# Patient Record
Sex: Female | Born: 1955 | Race: Black or African American | Hispanic: No | Smoking: Never smoker
Health system: Southern US, Community
[De-identification: ages and names within clinical notes are randomized; demographics above are authoritative.]

## PROBLEM LIST (undated history)

## (undated) DIAGNOSIS — G4733 Obstructive sleep apnea (adult) (pediatric): Secondary | ICD-10-CM

## (undated) DIAGNOSIS — E7800 Pure hypercholesterolemia, unspecified: Secondary | ICD-10-CM

## (undated) DIAGNOSIS — E119 Type 2 diabetes mellitus without complications: Secondary | ICD-10-CM

## (undated) DIAGNOSIS — Z9989 Dependence on other enabling machines and devices: Secondary | ICD-10-CM

## (undated) DIAGNOSIS — I10 Essential (primary) hypertension: Secondary | ICD-10-CM

## (undated) DIAGNOSIS — M1990 Unspecified osteoarthritis, unspecified site: Secondary | ICD-10-CM

## (undated) DIAGNOSIS — J42 Unspecified chronic bronchitis: Secondary | ICD-10-CM

## (undated) DIAGNOSIS — R569 Unspecified convulsions: Secondary | ICD-10-CM

## (undated) DIAGNOSIS — G459 Transient cerebral ischemic attack, unspecified: Secondary | ICD-10-CM

## (undated) DIAGNOSIS — I509 Heart failure, unspecified: Secondary | ICD-10-CM

---

## 1981-10-23 HISTORY — PX: TUBAL LIGATION: SHX77

## 2005-10-20 ENCOUNTER — Emergency Department (HOSPITAL_COMMUNITY): Admission: EM | Admit: 2005-10-20 | Discharge: 2005-10-20 | Payer: Self-pay | Admitting: Emergency Medicine

## 2014-07-15 ENCOUNTER — Encounter (HOSPITAL_COMMUNITY): Payer: Self-pay | Admitting: Emergency Medicine

## 2014-07-15 DIAGNOSIS — G459 Transient cerebral ischemic attack, unspecified: Principal | ICD-10-CM | POA: Diagnosis present

## 2014-07-15 DIAGNOSIS — Z79899 Other long term (current) drug therapy: Secondary | ICD-10-CM

## 2014-07-15 DIAGNOSIS — Z6841 Body Mass Index (BMI) 40.0 and over, adult: Secondary | ICD-10-CM

## 2014-07-15 DIAGNOSIS — G8190 Hemiplegia, unspecified affecting unspecified side: Secondary | ICD-10-CM | POA: Diagnosis present

## 2014-07-15 DIAGNOSIS — E785 Hyperlipidemia, unspecified: Secondary | ICD-10-CM | POA: Diagnosis present

## 2014-07-15 DIAGNOSIS — G4733 Obstructive sleep apnea (adult) (pediatric): Secondary | ICD-10-CM | POA: Diagnosis present

## 2014-07-15 DIAGNOSIS — R29898 Other symptoms and signs involving the musculoskeletal system: Secondary | ICD-10-CM | POA: Diagnosis not present

## 2014-07-15 DIAGNOSIS — I10 Essential (primary) hypertension: Secondary | ICD-10-CM | POA: Diagnosis present

## 2014-07-15 DIAGNOSIS — E119 Type 2 diabetes mellitus without complications: Secondary | ICD-10-CM | POA: Diagnosis present

## 2014-07-15 LAB — COMPREHENSIVE METABOLIC PANEL
ALBUMIN: 3.6 g/dL (ref 3.5–5.2)
ALT: 16 U/L (ref 0–35)
AST: 18 U/L (ref 0–37)
Alkaline Phosphatase: 77 U/L (ref 39–117)
Anion gap: 15 (ref 5–15)
BUN: 14 mg/dL (ref 6–23)
CO2: 29 mEq/L (ref 19–32)
CREATININE: 0.8 mg/dL (ref 0.50–1.10)
Calcium: 8.9 mg/dL (ref 8.4–10.5)
Chloride: 98 mEq/L (ref 96–112)
GFR calc Af Amer: >90 mL/min (ref >90)
GFR calc non Af Amer: 80 mL/min — ABNORMAL LOW (ref >90)
Glucose, Bld: 251 mg/dL — ABNORMAL HIGH (ref 70–99)
Potassium: 3.5 mEq/L — ABNORMAL LOW (ref 3.7–5.3)
Sodium: 142 mEq/L (ref 137–147)
TOTAL PROTEIN: 7.4 g/dL (ref 6.0–8.3)
Total Bilirubin: 0.7 mg/dL (ref 0.3–1.2)

## 2014-07-15 LAB — CBC WITH DIFFERENTIAL/PLATELET
BASOS ABS: 0 10*3/uL (ref 0.0–0.1)
BASOS PCT: 0 % (ref 0–1)
EOS ABS: 0.1 10*3/uL (ref 0.0–0.7)
Eosinophils Relative: 1 % (ref 0–5)
HCT: 39.4 % (ref 36.0–46.0)
Hemoglobin: 13.4 g/dL (ref 12.0–15.0)
Lymphocytes Relative: 26 % (ref 12–46)
Lymphs Abs: 2.1 10*3/uL (ref 0.7–4.0)
MCH: 29.9 pg (ref 26.0–34.0)
MCHC: 34 g/dL (ref 30.0–36.0)
MCV: 87.9 fL (ref 78.0–100.0)
MONOS PCT: 7 % (ref 3–12)
Monocytes Absolute: 0.5 10*3/uL (ref 0.1–1.0)
NEUTROS ABS: 5.4 10*3/uL (ref 1.7–7.7)
Neutrophils Relative %: 66 % (ref 43–77)
Platelets: 219 10*3/uL (ref 150–400)
RBC: 4.48 MIL/uL (ref 3.87–5.11)
RDW: 13.8 % (ref 11.5–15.5)
WBC: 8.2 10*3/uL (ref 4.0–10.5)

## 2014-07-15 NOTE — ED Notes (Signed)
Pt. reports generalized body numbness onset this evening 9 pm , no numbness at arrival , alert and oriented / speech clear , no facial asymmetry , no arm drift . Denies pain / respirations unlabored .

## 2014-07-16 ENCOUNTER — Inpatient Hospital Stay (HOSPITAL_COMMUNITY): Payer: Federal, State, Local not specified - PPO

## 2014-07-16 ENCOUNTER — Inpatient Hospital Stay (HOSPITAL_COMMUNITY)
Admission: EM | Admit: 2014-07-16 | Discharge: 2014-07-17 | DRG: 069 | Disposition: A | Discharge status: Home / Self Care | Payer: Federal, State, Local not specified - PPO | Attending: Internal Medicine | Admitting: Internal Medicine

## 2014-07-16 ENCOUNTER — Encounter (HOSPITAL_COMMUNITY): Payer: Self-pay

## 2014-07-16 ENCOUNTER — Emergency Department (HOSPITAL_COMMUNITY): Payer: Federal, State, Local not specified - PPO

## 2014-07-16 DIAGNOSIS — R2990 Unspecified symptoms and signs involving the nervous system: Secondary | ICD-10-CM | POA: Insufficient documentation

## 2014-07-16 DIAGNOSIS — I369 Nonrheumatic tricuspid valve disorder, unspecified: Secondary | ICD-10-CM

## 2014-07-16 DIAGNOSIS — G4733 Obstructive sleep apnea (adult) (pediatric): Secondary | ICD-10-CM

## 2014-07-16 DIAGNOSIS — Z79899 Other long term (current) drug therapy: Secondary | ICD-10-CM | POA: Diagnosis not present

## 2014-07-16 DIAGNOSIS — R209 Unspecified disturbances of skin sensation: Secondary | ICD-10-CM

## 2014-07-16 DIAGNOSIS — I10 Essential (primary) hypertension: Secondary | ICD-10-CM | POA: Diagnosis present

## 2014-07-16 DIAGNOSIS — E119 Type 2 diabetes mellitus without complications: Secondary | ICD-10-CM

## 2014-07-16 DIAGNOSIS — E785 Hyperlipidemia, unspecified: Secondary | ICD-10-CM | POA: Diagnosis present

## 2014-07-16 DIAGNOSIS — R200 Anesthesia of skin: Secondary | ICD-10-CM

## 2014-07-16 DIAGNOSIS — R29898 Other symptoms and signs involving the musculoskeletal system: Secondary | ICD-10-CM | POA: Diagnosis present

## 2014-07-16 DIAGNOSIS — G459 Transient cerebral ischemic attack, unspecified: Secondary | ICD-10-CM | POA: Diagnosis present

## 2014-07-16 DIAGNOSIS — Z6841 Body Mass Index (BMI) 40.0 and over, adult: Secondary | ICD-10-CM | POA: Diagnosis not present

## 2014-07-16 DIAGNOSIS — Z9989 Dependence on other enabling machines and devices: Secondary | ICD-10-CM

## 2014-07-16 DIAGNOSIS — G8190 Hemiplegia, unspecified affecting unspecified side: Secondary | ICD-10-CM | POA: Diagnosis present

## 2014-07-16 HISTORY — DX: Essential (primary) hypertension: I10

## 2014-07-16 LAB — BASIC METABOLIC PANEL
Anion gap: 15 (ref 5–15)
BUN: 16 mg/dL (ref 6–23)
CO2: 27 meq/L (ref 19–32)
Calcium: 8.6 mg/dL (ref 8.4–10.5)
Chloride: 99 mEq/L (ref 96–112)
Creatinine, Ser: 0.81 mg/dL (ref 0.50–1.10)
GFR calc Af Amer: >90 mL/min (ref >90)
GFR, EST NON AFRICAN AMERICAN: 79 mL/min — AB (ref >90)
GLUCOSE: 233 mg/dL — AB (ref 70–99)
POTASSIUM: 3.4 meq/L — AB (ref 3.7–5.3)
Sodium: 141 mEq/L (ref 137–147)

## 2014-07-16 LAB — URINALYSIS, ROUTINE W REFLEX MICROSCOPIC
Bilirubin Urine: NEGATIVE
GLUCOSE, UA: NEGATIVE mg/dL
Hgb urine dipstick: NEGATIVE
Ketones, ur: NEGATIVE mg/dL
Leukocytes, UA: NEGATIVE
NITRITE: NEGATIVE
PH: 5 (ref 5.0–8.0)
Protein, ur: NEGATIVE mg/dL
SPECIFIC GRAVITY, URINE: 1.014 (ref 1.005–1.030)
Urobilinogen, UA: 0.2 mg/dL (ref 0.0–1.0)

## 2014-07-16 LAB — GLUCOSE, CAPILLARY
Glucose-Capillary: 199 mg/dL — ABNORMAL HIGH (ref 70–99)
Glucose-Capillary: 187 mg/dL — ABNORMAL HIGH (ref 70–99)

## 2014-07-16 LAB — HEMOGLOBIN A1C
HEMOGLOBIN A1C: 10.3 % — AB (ref <5.7)
HEMOGLOBIN A1C: 10.7 % — AB (ref <5.7)
MEAN PLASMA GLUCOSE: 249 mg/dL — AB (ref <117)
MEAN PLASMA GLUCOSE: 260 mg/dL — AB (ref <117)

## 2014-07-16 LAB — RAPID URINE DRUG SCREEN, HOSP PERFORMED
AMPHETAMINES: NOT DETECTED
BARBITURATES: NOT DETECTED
Benzodiazepines: NOT DETECTED
Cocaine: NOT DETECTED
Opiates: NOT DETECTED
Tetrahydrocannabinol: NOT DETECTED

## 2014-07-16 LAB — TROPONIN I
Troponin I: <0.3 ng/mL (ref <0.30)
Troponin I: <0.3 ng/mL (ref <0.30)
Troponin I: <0.3 ng/mL (ref <0.30)

## 2014-07-16 LAB — CBG MONITORING, ED: GLUCOSE-CAPILLARY: 180 mg/dL — AB (ref 70–99)

## 2014-07-16 MED ORDER — STUDY - INVESTIGATIONAL DRUG SIMPLE RECORD
100.0000 mg | Freq: Every day | Status: DC
Start: 2014-07-17 — End: 2014-07-17
  Administered 2014-07-17: 100 mg via ORAL
  Filled 2014-07-16 (×2): qty 100

## 2014-07-16 MED ORDER — STUDY - INVESTIGATIONAL DRUG SIMPLE RECORD
180.0000 mg | Freq: Once | Status: AC
  Administered 2014-07-16: 180 mg via ORAL
  Filled 2014-07-16: qty 180

## 2014-07-16 MED ORDER — INSULIN ASPART 100 UNIT/ML ~~LOC~~ SOLN
0.0000 [IU] | Freq: Three times a day (TID) | SUBCUTANEOUS | Status: DC
  Administered 2014-07-16 – 2014-07-17 (×4): 3 [IU] via SUBCUTANEOUS
  Administered 2014-07-17: 5 [IU] via SUBCUTANEOUS
  Filled 2014-07-16: qty 1

## 2014-07-16 MED ORDER — HEPARIN SODIUM (PORCINE) 5000 UNIT/ML IJ SOLN
5000.0000 [IU] | Freq: Three times a day (TID) | INTRAMUSCULAR | Status: DC
  Administered 2014-07-16: 5000 [IU] via SUBCUTANEOUS
  Filled 2014-07-16: qty 1

## 2014-07-16 MED ORDER — STUDY - INVESTIGATIONAL DRUG SIMPLE RECORD
90.0000 mg | Freq: Two times a day (BID) | Status: DC
  Administered 2014-07-17: 90 mg via ORAL
  Filled 2014-07-16 (×3): qty 90

## 2014-07-16 MED ORDER — INSULIN ASPART 100 UNIT/ML ~~LOC~~ SOLN: 0.0000 [IU] | Freq: Three times a day (TID) | SUBCUTANEOUS | Status: DC

## 2014-07-16 MED ORDER — PREGABALIN 50 MG PO CAPS
50.0000 mg | ORAL_CAPSULE | Freq: Every day | ORAL | Status: DC
  Administered 2014-07-16: 50 mg via ORAL
  Filled 2014-07-16: qty 1

## 2014-07-16 MED ORDER — LORAZEPAM 2 MG/ML IJ SOLN
1.0000 mg | Freq: Once | INTRAMUSCULAR | Status: AC
  Administered 2014-07-16: 1 mg via INTRAVENOUS
  Filled 2014-07-16: qty 1

## 2014-07-16 MED ORDER — ATORVASTATIN CALCIUM 40 MG PO TABS
40.0000 mg | ORAL_TABLET | Freq: Every day | ORAL | Status: DC
  Administered 2014-07-16: 40 mg via ORAL
  Filled 2014-07-16 (×2): qty 1

## 2014-07-16 MED ORDER — STUDY - INVESTIGATIONAL DRUG SIMPLE RECORD
300.0000 mg | Freq: Once | Status: AC
  Administered 2014-07-16: 300 mg via ORAL
  Filled 2014-07-16: qty 300

## 2014-07-16 MED ORDER — INSULIN GLARGINE 100 UNIT/ML ~~LOC~~ SOLN
5.0000 [IU] | Freq: Every day | SUBCUTANEOUS | Status: DC
  Administered 2014-07-16: 5 [IU] via SUBCUTANEOUS
  Filled 2014-07-16 (×2): qty 0.05

## 2014-07-16 MED ORDER — POTASSIUM CHLORIDE 10 MEQ/100ML IV SOLN
10.0000 meq | INTRAVENOUS | Status: AC
  Administered 2014-07-16 (×2): 10 meq via INTRAVENOUS
  Filled 2014-07-16 (×2): qty 100

## 2014-07-16 MED ORDER — INSULIN ASPART 100 UNIT/ML ~~LOC~~ SOLN: 0.0000 [IU] | Freq: Every day | SUBCUTANEOUS | Status: DC

## 2014-07-16 MED ORDER — TRAMADOL HCL 50 MG PO TABS: 50.0000 mg | ORAL_TABLET | Freq: Every day | ORAL | Status: DC | PRN

## 2014-07-16 MED ORDER — CARVEDILOL 12.5 MG PO TABS
25.0000 mg | ORAL_TABLET | Freq: Two times a day (BID) | ORAL | Status: DC
Start: 2014-07-16 — End: 2014-07-17
  Administered 2014-07-16 – 2014-07-17 (×4): 25 mg via ORAL
  Filled 2014-07-16: qty 1
  Filled 2014-07-16: qty 2
  Filled 2014-07-16: qty 1
  Filled 2014-07-16: qty 2
  Filled 2014-07-16: qty 1
  Filled 2014-07-16: qty 2

## 2014-07-16 MED ORDER — STROKE: EARLY STAGES OF RECOVERY BOOK
Freq: Once | Status: AC
  Administered 2014-07-16: 1
  Filled 2014-07-16: qty 1

## 2014-07-16 MED ORDER — ASPIRIN EC 325 MG PO TBEC
325.0000 mg | DELAYED_RELEASE_TABLET | Freq: Every day | ORAL | Status: DC
  Administered 2014-07-16: 325 mg via ORAL
  Filled 2014-07-16: qty 1

## 2014-07-16 MED ORDER — CLONIDINE HCL 0.1 MG PO TABS
0.1000 mg | ORAL_TABLET | Freq: Two times a day (BID) | ORAL | Status: DC
  Administered 2014-07-16 – 2014-07-17 (×3): 0.1 mg via ORAL
  Filled 2014-07-16 (×3): qty 1

## 2014-07-16 MED ORDER — SENNOSIDES-DOCUSATE SODIUM 8.6-50 MG PO TABS
1.0000 | ORAL_TABLET | Freq: Every evening | ORAL | Status: DC | PRN
  Filled 2014-07-16: qty 1

## 2014-07-16 MED ORDER — HYDRALAZINE HCL 20 MG/ML IJ SOLN: 10.0000 mg | Freq: Four times a day (QID) | INTRAMUSCULAR | Status: DC | PRN

## 2014-07-16 NOTE — ED Notes (Signed)
Patient transported to CT 

## 2014-07-16 NOTE — H&P (Signed)
[facility] History and Physical  Jacquelynne Guedes ZOX:[id number] DOB: July 08, 1956 DOA: 07/16/2014  Referring physician: ED physician PCP: Allyson Sabal, MD  Specialists:   Chief Complaint: Left side numbness and weakness  HPI: Christy Potter is a 58 y.o. female  with past medical history of HTN, DM, OSA, who presents with left sided numbness and weakness.  Patient reports that while she was working last evening, she started feeling numb and weak in her left side face, arm and leg at about 9:00 PM. She was noticed to have slurred speech by her colleagues. She has a mild headache, but does not have change of hearing or vision. No fever, chills, cough, chest pain, shortness of breath, nausea, vomiting, abdominal pain, dysuria. She was brought to ED by her cousin who is her coworker. Her symptoms resolved after she arrived to ED. CT brain showed no acute intracranial findings. Neurology was consulted, suggested admitted to inpatient for TIA/stroke workup     Review of Systems: As presented in the history of presenting illness, rest negative.  Where does patient live?  Lives a lone in [place] Can patient participate in ADLs? Yes  Allergy: No Known Allergies  Past Medical History  Diagnosis Date  . Diabetes mellitus without complication   . Hypertension     History reviewed. No pertinent past surgical history.  Social History:  reports that she has never smoked. She does not have any smokeless tobacco history on file. She reports that she does not drink alcohol or use illicit drugs.  Family History:  Family History  Problem Relation Age of Onset  . Hypertension Father   . Hypertension Sister      Prior to Admission medications   Medication Sig Start Date End Date Taking? Authorizing Provider  Alogliptin-Metformin HCl 12.02-999 MG TABS Take 1 tablet by mouth 2 (two) times daily with a meal.   Yes Historical Provider, MD  amLODipine-benazepril (LOTREL) 10-40 MG per capsule  Take 1 capsule by mouth daily.   Yes Historical Provider, MD  carvedilol (COREG) 25 MG tablet Take 25 mg by mouth 2 (two) times daily with a meal.   Yes Historical Provider, MD  celecoxib (CELEBREX) 200 MG capsule Take 200 mg by mouth at bedtime.   Yes Historical Provider, MD  cloNIDine (CATAPRES) 0.1 MG tablet Take 0.1 mg by mouth 2 (two) times daily.   Yes Historical Provider, MD  furosemide (LASIX) 40 MG tablet Take 40 mg by mouth daily.   Yes Historical Provider, MD  potassium chloride SA (K-DUR,KLOR-CON) 20 MEQ tablet Take 20 mEq by mouth daily.   Yes Historical Provider, MD  pregabalin (LYRICA) 50 MG capsule Take 50 mg by mouth at bedtime.   Yes Historical Provider, MD  telmisartan-hydrochlorothiazide (MICARDIS HCT) 80-25 MG per tablet Take 1 tablet by mouth daily.   Yes Historical Provider, MD  traMADol (ULTRAM) 50 MG tablet Take 50 mg by mouth daily as needed for moderate pain.   Yes Historical Provider, MD    Physical Exam: Filed Vitals:   07/16/14 0300 07/16/14 0330 07/16/14 0340 07/16/14 0342  BP: 118/60 145/70    Pulse: 72 76    Temp:    98.4 F (36.9 C)  TempSrc:    Oral  Resp: 20 23    Height:      Weight:      SpO2: 94% 93% 94%    General: Not in acute distress HEENT:       Eyes: PERRL, EOMI, no scleral icterus  ENT: No discharge from the ears and nose, no pharynx injection, no tonsillar enlargement.        Neck: No JVD, no bruit, no mass felt. Cardiac: S1/S2, RRR, No murmurs, gallops or rubs Pulm: Good air movement bilaterally. Clear to auscultation bilaterally. No rales, wheezing, rhonchi or rubs. Abd: Soft, nondistended, nontender, no rebound pain, no organomegaly, BS present Ext: No edema. 2+DP/PT pulse bilaterally Musculoskeletal: No joint deformities, erythema, or stiffness, ROM full Skin: No rashes.  Neuro: Alert and oriented X3, cranial nerves II-XII grossly intact, muscle strength 5/5 in all extremeties, sensation to light touch intact. Brachial reflex  2+ bilaterally. Knee reflex 1+ bilaterally. Negative Babinski's sign. Normal finger to nose test. Psych: Patient is not psychotic, no suicidal or hemocidal ideation.  Labs on Admission:  Basic Metabolic Panel:  Recent Labs Lab 07/15/14 2202  NA 142  K 3.5*  CL 98  CO2 29  GLUCOSE 251*  BUN 14  CREATININE 0.80  CALCIUM 8.9   Liver Function Tests:  Recent Labs Lab 07/15/14 2202  AST 18  ALT 16  ALKPHOS 77  BILITOT 0.7  PROT 7.4  ALBUMIN 3.6   No results found for this basename: LIPASE, AMYLASE,  in the last 168 hours No results found for this basename: AMMONIA,  in the last 168 hours CBC:  Recent Labs Lab 07/15/14 2202  WBC 8.2  NEUTROABS 5.4  HGB 13.4  HCT 39.4  MCV 87.9  PLT 219   Cardiac Enzymes: No results found for this basename: CKTOTAL, CKMB, CKMBINDEX, TROPONINI,  in the last 168 hours  BNP (last 3 results) No results found for this basename: PROBNP,  in the last 8760 hours CBG: No results found for this basename: GLUCAP,  in the last 168 hours  Radiological Exams on Admission: Ct Head Wo Contrast  07/16/2014   CLINICAL DATA:  Left-sided numbness.  EXAM: CT HEAD WITHOUT CONTRAST  TECHNIQUE: Contiguous axial images were obtained from the base of the skull through the vertex without intravenous contrast.  COMPARISON:  None.  FINDINGS: Skull and Sinuses:There are secretions layering within the right sphenoid sinus. Opacification of the right mastoid tip, with sclerosis, most consistent with chronic mastoiditis. Postinflammatory mucosal disease in the inferior right maxillary antrum.  Orbits: No acute abnormality.  Brain: No evidence of acute abnormality, such as acute infarction, hemorrhage, hydrocephalus, or mass lesion/mass effect. There is a halo of low-attenuation around the nondilated lateral ventricles. Although nonspecific, this is most consistent with chronic small vessel disease -especially in light of history of hypertension and diabetes. Age related  generalized cerebral volume loss.  IMPRESSION: 1. No acute intracranial findings. 2. Mild chronic small vessel disease. 3. Right sphenoid sinus effusion.   Electronically Signed   By: Tiburcio Pea M.D.   On: 07/16/2014 02:12    EKG: Independently reviewed.   Assessment/Plan Principal Problem:   TIA (transient ischemic attack) Active Problems:   Diabetes mellitus without complication   Hypertension   OSA on CPAP   1.TIA: Patient's presentation is likely due to TIA or small stroke. No signs of infection. CT head has no acute intracranial findings. Neurology was consulted, suggested TIA or stroke workup. Her risk factors including diabetes type 2, hypertension and OSA.   -  Admitted to telemetry bed and a follow neurology's recommendation for further workup and treatment -  HgbA1c, fasting lipid panel -  MRI, MRA  of the brain without contrast -  2D Echocardiogram -  Carotid dopplers -  aspirin 325  mg daily, lipitor -  PT/OT SLP -  trop x 3  -  UDS -  EKG - PT/OT  2. HTN: Blood pressure is 145/70. -Will hold Lotrel, Lasix and Micardis-HCTZ in the setting of acute stroke or TIA for permissive hypertension - Continue Coreg and clonidine to avoid significant rebound effect  3. DM-II: on Alogliptin-metformin at home - will hold home oral medications - Start sliding scale insulin - start lantus 5 U daily - Check A1c  4. OSA: CPAP   DVT ppx: SQ Heparin   Code Status: Full code Family Communication: None at bed side. Disposition Plan: Admit to inpatient   Lorretta Harp [facility] Pager [phone]  If 7PM-7AM, please contact night-coverage [email or web address] Password [email or web address] 07/16/2014, 4:00 AM

## 2014-07-16 NOTE — ED Notes (Signed)
Pt  has returned from mri ?

## 2014-07-16 NOTE — Progress Notes (Signed)
RT placed patient on CPAP, auto 5-15cmH20, 21%.  Patient tolerating well.

## 2014-07-16 NOTE — Progress Notes (Signed)
OT Cancellation Note  Patient Details Name: Christy Potter MRN: [id number] DOB: [birth year]   Cancelled Treatment:     Pt off floor at MRI. OT will follow up as available to complete evaluation.   Nena Jordan M 07/16/2014, 11:25 AM  Carney Living, OTR/L Occupational Therapist [phone] (pager)

## 2014-07-16 NOTE — Consult Note (Signed)
Referring Physician: Dr. Reather Converse    Chief Complaint: left sided numbness-weakness, left face weakness, dysarthria  HPI:                                                                                                                                         Christy Potter is an 58 y.o. female with a past medical history significant for HTN, DM, comes in accompanied by her family for further evaluation of the above stated symptoms. She said that she was doing well until approximately 8:55 pm last night when she developed gradual onset of numbness-tingling of the left face travelling to the left arm and leg and also noticed heaviness/weakness of the left leg to the point that she couldn't put weight on it. She said that her coworkers noticed that she was slurring her words and the left side of her face was droopier than the right. Had some HA afterwards but denies vertigo, double vision, difficulty swallowing, confusion, vision changes, chest pain, SOB, or palpitations. Takes aspirin 81 mg daily. Daughter is at the bedside and said that the speech is significantly improved but the left face is little bit droopy. CT brain showed no acute intracranial findings.    Date last known well: 07/15/14 Time last known well: 8:55 pm tPA Given: no, late presentation  Past Medical History  Diagnosis Date  . Diabetes mellitus without complication   . Hypertension     History reviewed. No pertinent past surgical history.  No family history on file. Social History:  reports that she has never smoked. She does not have any smokeless tobacco history on file. She reports that she does not drink alcohol or use illicit drugs.  Allergies: No Known Allergies  Medications:                                                                                                                           I have reviewed the patient's current medications.  ROS:  History obtained from the patient, family, and chart review.  General ROS: negative for - chills, fatigue, fever, night sweats, or weight loss Psychological ROS: negative for - behavioral disorder, hallucinations, memory difficulties, mood swings or suicidal ideation Ophthalmic ROS: negative for - blurry vision, double vision, eye pain or loss of vision ENT ROS: negative for - epistaxis, nasal discharge, oral lesions, sore throat, tinnitus or vertigo Allergy and Immunology ROS: negative for - hives or itchy/watery eyes Hematological and Lymphatic ROS: negative for - bleeding problems, bruising or swollen lymph nodes Endocrine ROS: negative for - galactorrhea, hair pattern changes, polydipsia/polyuria or temperature intolerance Respiratory ROS: negative for - cough, hemoptysis, shortness of breath or wheezing Cardiovascular ROS: negative for - chest pain, dyspnea on exertion, edema or irregular heartbeat Gastrointestinal ROS: negative for - abdominal pain, diarrhea, hematemesis, nausea/vomiting or stool incontinence Genito-Urinary ROS: negative for - dysuria, hematuria, incontinence or urinary frequency/urgency Musculoskeletal ROS: negative for - joint swelling Neurological ROS: as noted in HPI Dermatological ROS: negative for rash and skin lesion changes  Physical exam: pleasant female in no apparent distress. Blood pressure 127/79, pulse 73, temperature 98.3 F (36.8 C), temperature source Oral, resp. rate 18, height 5' (1.524 m), weight 123.832 kg (273 lb), SpO2 96.00%. Head: normocephalic. Neck: supple, no bruits, no JVD. Cardiac: no murmurs. Lungs: clear. Abdomen: soft, no tender, no mass. Extremities: no edema.  Neurologic Examination:                                                                                                      General: Mental Status: Alert, oriented, thought content appropriate.  Speech  fluent without evidence of aphasia.  Able to follow 3 step commands without difficulty. Cranial Nerves: II: Discs flat bilaterally; Visual fields grossly normal, pupils equal, round, reactive to light and accommodation III,IV, VI: ptosis not present, extra-ocular motions intact bilaterally V,VII: smile symmetric, facial light touch sensation normal bilaterally VIII: hearing normal bilaterally IX,X: gag reflex present XI: bilateral shoulder shrug XII: midline tongue extension without atrophy or fasciculations Motor: Mild left sided weakness Tone and bulk:normal tone throughout Sensory: Pinprick and light touch mildly diminished left leg Deep Tendon Reflexes:  1 all over Plantars: Right: downgoing   Left: downgoing Cerebellar: normal finger-to-nose,  normal heel-to-shin test Gait:  No tested    Results for orders placed during the hospital encounter of 07/16/14 (from the past 48 hour(s))  CBC WITH DIFFERENTIAL     Status: None   Collection Time    07/15/14 10:02 PM      Result Value Ref Range   WBC 8.2  4.0 - 10.5 K/uL   RBC 4.48  3.87 - 5.11 MIL/uL   Hemoglobin 13.4  12.0 - 15.0 g/dL   HCT 39.4  36.0 - 46.0 %   MCV 87.9  78.0 - 100.0 fL   MCH 29.9  26.0 - 34.0 pg   MCHC 34.0  30.0 - 36.0 g/dL   RDW 13.8  11.5 - 15.5 %   Platelets 219  150 - 400 K/uL   Neutrophils Relative % 66  43 - 77 %   Neutro Abs 5.4  1.7 - 7.7 K/uL   Lymphocytes Relative 26  12 - 46 %   Lymphs Abs 2.1  0.7 - 4.0 K/uL   Monocytes Relative 7  3 - 12 %   Monocytes Absolute 0.5  0.1 - 1.0 K/uL   Eosinophils Relative 1  0 - 5 %   Eosinophils Absolute 0.1  0.0 - 0.7 K/uL   Basophils Relative 0  0 - 1 %   Basophils Absolute 0.0  0.0 - 0.1 K/uL  COMPREHENSIVE METABOLIC PANEL     Status: Abnormal   Collection Time    07/15/14 10:02 PM      Result Value Ref Range   Sodium 142  137 - 147 mEq/L   Potassium 3.5 (*) 3.7 - 5.3 mEq/L   Chloride 98  96 - 112 mEq/L   CO2 29  19 - 32 mEq/L   Glucose, Bld 251  (*) 70 - 99 mg/dL   BUN 14  6 - 23 mg/dL   Creatinine, Ser 0.80  0.50 - 1.10 mg/dL   Calcium 8.9  8.4 - 10.5 mg/dL   Total Protein 7.4  6.0 - 8.3 g/dL   Albumin 3.6  3.5 - 5.2 g/dL   AST 18  0 - 37 U/L   ALT 16  0 - 35 U/L   Alkaline Phosphatase 77  39 - 117 U/L   Total Bilirubin 0.7  0.3 - 1.2 mg/dL   GFR calc non Af Amer 80 (*) >90 mL/min   GFR calc Af Amer >90  >90 mL/min   Comment: (NOTE)     The eGFR has been calculated using the CKD EPI equation.     This calculation has not been validated in all clinical situations.     eGFR's persistently <90 mL/min signify possible Chronic Kidney     Disease.   Anion gap 15  5 - 15   No results found.   Assessment: 58 y.o. female with acute onset left sided weakness, dysarthria, and left face weakness that is remarkably better at this time. CT brain showed no acute intracranial findings. Concern for right subcortical infarct. Patient out of the window for IV thrombolysis and her deficits are not severe enough to suggest proximal MCA clot/thrombus. Admit to medicine and complete stroke work up. Aspirin 325 mg daily after she passes swallowing evaluation. Stroke team will follow up in am.  Stroke Risk Factors - HTN, DM, Plan: 1. HgbA1c, fasting lipid panel 2. MRI, MRA  of the brain without contrast 3. Echocardiogram 4. Carotid dopplers 5. Prophylactic therapy-aspirin 325 mg daily 6. Risk factor modification 7. Telemetry monitoring 8. Frequent neuro checks 9. PT/OT SLP   Dorian Pod, MD [facility] [phone]  07/16/2014, 1:51 AM

## 2014-07-16 NOTE — ED Provider Notes (Signed)
CSN: [id number]     Arrival date & time 07/15/14  2143 History   First MD Initiated Contact with Patient 07/16/14 0046     Chief Complaint  Patient presents with  . Numbness     (Consider location/radiation/quality/duration/timing/severity/associated sxs/prior Treatment) HPI Comments: 58 year old female with diabetes, high blood pressure presents after episode of left-sided numbness and speech difficulty around 9 PM this evening. Symptoms resolved just prior to arrival. No history of similar stroke history. Patient did not have acute onset headache prior and no fevers. Patient feels well currently. Symptoms improved with time.  The history is provided by the patient.    Past Medical History  Diagnosis Date  . Diabetes mellitus without complication   . Hypertension    History reviewed. No pertinent past surgical history. No family history on file. History  Substance Use Topics  . Smoking status: Never Smoker   . Smokeless tobacco: Not on file  . Alcohol Use: No   OB History   Grav Para Term Preterm Abortions TAB SAB Ect Mult Living                 Review of Systems  Constitutional: Negative for fever and chills.  HENT: Negative for congestion.   Eyes: Negative for visual disturbance.  Respiratory: Negative for shortness of breath.   Cardiovascular: Negative for chest pain.  Gastrointestinal: Negative for vomiting and abdominal pain.  Genitourinary: Negative for dysuria and flank pain.  Musculoskeletal: Negative for back pain, neck pain and neck stiffness.  Skin: Negative for rash.  Neurological: Positive for speech difficulty and numbness. Negative for weakness, light-headedness and headaches.      Allergies  Review of patient's allergies indicates no known allergies.  Home Medications   Prior to Admission medications   Medication Sig Start Date End Date Taking? Authorizing Provider  Alogliptin-Metformin HCl 12.02-999 MG TABS Take 1 tablet by mouth 2 (two) times  daily with a meal.   Yes Historical Provider, MD  amLODipine-benazepril (LOTREL) 10-40 MG per capsule Take 1 capsule by mouth daily.   Yes Historical Provider, MD  carvedilol (COREG) 25 MG tablet Take 25 mg by mouth 2 (two) times daily with a meal.   Yes Historical Provider, MD  celecoxib (CELEBREX) 200 MG capsule Take 200 mg by mouth at bedtime.   Yes Historical Provider, MD  cloNIDine (CATAPRES) 0.1 MG tablet Take 0.1 mg by mouth 2 (two) times daily.   Yes Historical Provider, MD  furosemide (LASIX) 40 MG tablet Take 40 mg by mouth daily.   Yes Historical Provider, MD  potassium chloride SA (K-DUR,KLOR-CON) 20 MEQ tablet Take 20 mEq by mouth daily.   Yes Historical Provider, MD  pregabalin (LYRICA) 50 MG capsule Take 50 mg by mouth at bedtime.   Yes Historical Provider, MD  telmisartan-hydrochlorothiazide (MICARDIS HCT) 80-25 MG per tablet Take 1 tablet by mouth daily.   Yes Historical Provider, MD  traMADol (ULTRAM) 50 MG tablet Take 50 mg by mouth daily as needed for moderate pain.   Yes Historical Provider, MD   BP 127/79  Pulse 73  Temp(Src) 98.3 F (36.8 C) (Oral)  Resp 18  Ht 5' (1.524 m)  Wt 273 lb (123.832 kg)  BMI 53.32 kg/m2  SpO2 96% Physical Exam  Nursing note and vitals reviewed. Constitutional: She is oriented to person, place, and time. She appears well-developed and well-nourished.  HENT:  Head: Normocephalic and atraumatic.  Eyes: Conjunctivae are normal. Right eye exhibits no discharge. Left eye exhibits no  discharge.  Neck: Normal range of motion. Neck supple. No tracheal deviation present.  Cardiovascular: Normal rate and regular rhythm.   Pulmonary/Chest: Effort normal and breath sounds normal.  Abdominal: Soft. She exhibits no distension. There is no tenderness. There is no guarding.  Musculoskeletal: She exhibits no edema.  Neurological: She is alert and oriented to person, place, and time. GCS eye subscore is 4. GCS verbal subscore is 5. GCS motor subscore is  6.  Mild left arm drift 5+ strength in  LE with f/e at major joints. Sensation to palpation intact in UE and LE. CNs 2-12 grossly intact.  EOMFI.  PERRL.   Finger nose and coordination intact bilateral.   Visual fields intact to finger testing.   Skin: Skin is warm. No rash noted.  Psychiatric: She has a normal mood and affect.    ED Course  Procedures (including critical care time) Labs Review Labs Reviewed  COMPREHENSIVE METABOLIC PANEL - Abnormal; Notable for the following:    Potassium 3.5 (*)    Glucose, Bld 251 (*)    GFR calc non Af Amer 80 (*)    All other components within normal limits  CBC WITH DIFFERENTIAL  URINALYSIS, ROUTINE W REFLEX MICROSCOPIC  HEMOGLOBIN A1C    Imaging Review No results found.   EKG Interpretation   Date/Time:  Wednesday July 15 2014 21:51:33 EDT Ventricular Rate:  80 PR Interval:  146 QRS Duration: 82 QT Interval:  410 QTC Calculation: 472 R Axis:   -16 Text Interpretation:  Normal sinus rhythm Moderate voltage criteria for  LVH, may be normal variant Borderline ECG Confirmed by Syair Fricker  MD, Crystallynn Noorani  ([id number]) on 07/16/2014 12:48:33 AM      MDM   Final diagnoses:  Left arm numbness  Stroke-like symptoms   Patient with 3 vascular risk factors presents after episode of stroke symptoms. Clinical concern for TIA versus small stroke. Mild left arm drift in ER. Patient feels well otherwise and neuro consult, CT head pending. Plan for telemetry observation with medicine.  No change in neuro exam in ER. Neurology evaluated and recommended MRI later today. Discussed the case with [facility]. The patients results and plan were reviewed and discussed.   Any x-rays performed were personally reviewed by myself.   Differential diagnosis were considered with the presenting HPI.  Medications  aspirin EC tablet 325 mg (not administered)    Filed Vitals:   07/15/14 2147 07/16/14 0048 07/16/14 0052  BP: 179/89 127/79   Pulse: 83  73   Temp: 98.3 F (36.8 C) 98.2 F (36.8 C) 98.3 F (36.8 C)  TempSrc: Oral Oral   Resp: 18 18   Height: 5' (1.524 m)    Weight: 273 lb (123.832 kg)    SpO2: 97% 96%     Final diagnoses:  None    Admission/ observation were discussed with the admitting physician, patient and/or family and they are comfortable with the plan.      Enid Skeens, MD 07/16/14 [phone]

## 2014-07-16 NOTE — Progress Notes (Signed)
Patient received [facility] meds -placebo/aspirin and ticagrelor/placebo.  Five rights of medication administration Assunta Found,. RN

## 2014-07-16 NOTE — Progress Notes (Signed)
Patient ID: Christy Potter  female  BJY:[id number]    DOB: [birth year]    DOA: 07/16/2014  PCP: Allyson Sabal, MD  Assessment/Plan: Principal Problem:   TIA (transient ischemic attack) - Symptoms improving MRI of the brain done, showed no acute stroke - MRA showed no large vessel stenosis - 2-D echo, carotid Dopplers pending - PT OT evaluation pending - Hemoglobin A1c, lipid panel pending - Continue aspirin, statin  Active Problems:   Diabetes mellitus without complication - Uncontrolled, placed on carb modified diet with sliding scale and Lantus    Hypertension - Permissive hypertension, placed on hydralazine with parameters    OSA on CPAP  Obesity - Counseled on diet and weight control   DVT Prophylaxis: Heparin subcutaneous  Code Status: Full code  Family Communication: Discussed in detail with patient's daughter at the bedside  Disposition:  Consultants:  Neurology  Procedures:  MRI, MRA brain  Antibiotics:  None    Subjective: Patient seen and examined, feeling better, somewhat groggy from Ativan received for the MRI  Objective: Weight change:  No intake or output data in the 24 hours ending 07/16/14 1243 Blood pressure 163/126, pulse 76, temperature 98.1 F (36.7 C), temperature source Oral, resp. rate 16, height 5' (1.524 m), weight 123.832 kg (273 lb), SpO2 97.00%.  Physical Exam: General: Somewhat groggy, easily arousable CVS: S1-S2 clear, no murmur rubs or gallops Chest: clear to auscultation bilaterally, no wheezing, rales or rhonchi Abdomen: soft nontender, nondistended, normal bowel sounds  Extremities: no cyanosis, clubbing or edema noted bilaterally Neuro: Cranial nerves II-XII intact, mild left-sided weakness improving  Lab Results: Basic Metabolic Panel:  Recent Labs Lab 07/15/14 2202 07/16/14 0445  NA 142 141  K 3.5* 3.4*  CL 98 99  CO2 29 27  GLUCOSE 251* 233*  BUN 14 16  CREATININE 0.80 0.81  CALCIUM 8.9 8.6    Liver Function Tests:  Recent Labs Lab 07/15/14 2202  AST 18  ALT 16  ALKPHOS 77  BILITOT 0.7  PROT 7.4  ALBUMIN 3.6   No results found for this basename: LIPASE, AMYLASE,  in the last 168 hours No results found for this basename: AMMONIA,  in the last 168 hours CBC:  Recent Labs Lab 07/15/14 2202  WBC 8.2  NEUTROABS 5.4  HGB 13.4  HCT 39.4  MCV 87.9  PLT 219   Cardiac Enzymes:  Recent Labs Lab 07/16/14 0445 07/16/14 1043  TROPONINI <0.30 <0.30   BNP: No components found with this basename: POCBNP,  CBG:  Recent Labs Lab 07/16/14 1155  GLUCAP 180*     Micro Results: No results found for this or any previous visit (from the past 240 hour(s)).  Studies/Results: Ct Head Wo Contrast  07/16/2014   CLINICAL DATA:  Left-sided numbness.  EXAM: CT HEAD WITHOUT CONTRAST  TECHNIQUE: Contiguous axial images were obtained from the base of the skull through the vertex without intravenous contrast.  COMPARISON:  None.  FINDINGS: Skull and Sinuses:There are secretions layering within the right sphenoid sinus. Opacification of the right mastoid tip, with sclerosis, most consistent with chronic mastoiditis. Postinflammatory mucosal disease in the inferior right maxillary antrum.  Orbits: No acute abnormality.  Brain: No evidence of acute abnormality, such as acute infarction, hemorrhage, hydrocephalus, or mass lesion/mass effect. There is a halo of low-attenuation around the nondilated lateral ventricles. Although nonspecific, this is most consistent with chronic small vessel disease -especially in light of history of hypertension and diabetes. Age related generalized cerebral volume loss.  IMPRESSION:  1. No acute intracranial findings. 2. Mild chronic small vessel disease. 3. Right sphenoid sinus effusion.   Electronically Signed   By: Tiburcio Pea M.D.   On: 07/16/2014 02:12   Mr Maxine Glenn Head Wo Contrast  07/16/2014   CLINICAL DATA:  LEFT-sided numbness and weakness beginning  07/15/2014 with speech difficulty. Partial improvement. Stroke risk factors include diabetes and hypertension. IV thrombolysis not given.  EXAM: MRI HEAD WITHOUT CONTRAST  MRA HEAD WITHOUT CONTRAST  TECHNIQUE: Multiplanar, multiecho pulse sequences of the brain and surrounding structures were obtained without intravenous contrast. Angiographic images of the head were obtained using MRA technique without contrast.  COMPARISON:  CT head earlier today is unremarkable.  FINDINGS: MRI HEAD FINDINGS  No evidence for acute infarction, hemorrhage, mass lesion, hydrocephalus, or extra-axial fluid. Normal for age cerebral volume. Mild to moderate subcortical and periventricular T2 and FLAIR hyperintensities, likely chronic microvascular ischemic change.  Flow voids are maintained. 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 These involve the supratentorial compartment as well as the cerebellum. The brainstem is spared. Cerebral amyloid angiopathy could have this appearance but is less favored in the absence of large vessel infarct or lobar hemorrhage. I am unable to identify an area suspicious for acute hemorrhage.  Empty sella, nonspecific finding. No tonsillar herniation. Upper cervical region poorly visualized. No acute sinus or mastoid disease. Grossly negative orbits.  MRA HEAD FINDINGS  The internal carotid arteries are widely patent. The basilar artery is widely patent with vertebrals codominant. There is no proximal intracranial stenosis or aneurysm. Minor irregularity distal MCA and PCA branches could suggest early intracranial atherosclerotic change.  IMPRESSION: No acute stroke is evident.  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 Associated chronic microvascular ischemic  change in the white matter.  Empty sella, nonspecific.  No proximal stenosis or occlusion of the intracranial circulation.   Electronically Signed   By: Davonna Belling M.D.   On: 07/16/2014 10:02   Mr Brain Wo Contrast  07/16/2014   CLINICAL DATA:  LEFT-sided numbness and weakness beginning 07/15/2014 with speech difficulty. Partial improvement. Stroke risk factors include diabetes and hypertension. IV thrombolysis not given.  EXAM: MRI HEAD WITHOUT CONTRAST  MRA HEAD WITHOUT CONTRAST  TECHNIQUE: Multiplanar, multiecho pulse sequences of the brain and surrounding structures were obtained without intravenous contrast. Angiographic images of the head were obtained using MRA technique without contrast.  COMPARISON:  CT head earlier today is unremarkable.  FINDINGS: MRI HEAD FINDINGS  No evidence for acute infarction, hemorrhage, mass lesion, hydrocephalus, or extra-axial fluid. Normal for age cerebral volume. Mild to moderate subcortical and periventricular T2 and FLAIR hyperintensities, likely chronic microvascular ischemic change.  Flow voids are maintained. 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 These involve the supratentorial compartment as well as the cerebellum. The brainstem is spared. Cerebral amyloid angiopathy could have this appearance but is less favored in the absence of large vessel infarct or lobar hemorrhage. I am unable to identify an area suspicious for acute hemorrhage.  Empty sella, nonspecific finding. No tonsillar herniation. Upper cervical region poorly visualized. No acute sinus or mastoid disease. Grossly negative orbits.  MRA HEAD FINDINGS  The internal carotid arteries are widely patent. The basilar artery is widely patent with vertebrals codominant.  There is no proximal intracranial stenosis or aneurysm. Minor irregularity distal MCA and PCA  branches could suggest early intracranial atherosclerotic change.  IMPRESSION: No acute stroke is evident.  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 Associated chronic microvascular ischemic change in the white matter.  Empty sella, nonspecific.  No proximal stenosis or occlusion of the intracranial circulation.   Electronically Signed   By: Davonna Belling M.D.   On: 07/16/2014 10:02    Medications: Scheduled Meds: . aspirin EC  325 mg Oral Daily  . atorvastatin  40 mg Oral q1800  . carvedilol  25 mg Oral BID WC  . cloNIDine  0.1 mg Oral BID  . heparin  5,000 Units Subcutaneous 3 times per day  . insulin aspart  0-15 Units Subcutaneous TID WC  . insulin aspart  0-5 Units Subcutaneous QHS  . insulin glargine  5 Units Subcutaneous QHS  . pregabalin  50 mg Oral QHS      LOS: 0 days   Ladena Jacquez M.D. [facility] 07/16/2014, 12:43 PM Pager: [phone]  If 7PM-7AM, please contact night-coverage [email or web address] Password [email or web address]

## 2014-07-16 NOTE — ED Notes (Signed)
Neuro at bedside to see pt

## 2014-07-16 NOTE — Progress Notes (Signed)
  Echocardiogram 2D Echocardiogram has been performed.  Arvil Chaco 07/16/2014, 3:43 PM

## 2014-07-16 NOTE — Progress Notes (Signed)
STROKE TEAM PROGRESS NOTE   HISTORY Christy Potter is an 58 y.o. female with a past medical history significant for HTN, DM, comes in accompanied by her family for further evaluation of left sided weakness and numbness. She said that she was doing well until approximately 8:55 pm last night 07/15/2014 when she developed gradual onset of numbness-tingling of the left face travelling to the left arm and leg and also noticed heaviness/weakness of the left leg to the point that she couldn't put weight on it. She said that her coworkers noticed that she was slurring her words and the left side of her face was droopier than the right. Had some HA afterwards but denies vertigo, double vision, difficulty swallowing, confusion, vision changes, chest pain, SOB, or palpitations. Takes aspirin 81 mg daily. Daughter is at the bedside and said that the speech is significantly improved but the left face is little bit droopy. CT brain showed no acute intracranial findings. Patient was not administered TPA secondary to delay in arrival. She is to be admitted to  for further evaluation and treatment.   SUBJECTIVE (INTERVAL HISTORY) Her daughter is at the bedside.  Overall she feels her condition is stable.    OBJECTIVE Temp:  [98.1 F (36.7 C)-98.4 F (36.9 C)] 98.1 F (36.7 C) (09/24 1056) Pulse Rate:  [62-83] 62 (09/24 1048) Cardiac Rhythm:  [-]  Resp:  [18-23] 19 (09/24 0600) BP: (118-179)/(59-89) 166/86 mmHg (09/24 1048) SpO2:  [92 %-97 %] 96 % (09/24 1048) Weight:  [123.832 kg (273 lb)] 123.832 kg (273 lb) (09/23 2147)  CBG (last 3)   Recent Labs  07/16/14 1155  GLUCAP 180*    Recent Labs Lab 07/15/14 2202 07/16/14 0445  NA 142 141  K 3.5* 3.4*  CL 98 99  CO2 29 27  GLUCOSE 251* 233*  BUN 14 16  CREATININE 0.80 0.81  CALCIUM 8.9 8.6    Recent Labs Lab 07/15/14 2202  AST 18  ALT 16  ALKPHOS 77  BILITOT 0.7  PROT 7.4  ALBUMIN 3.6    Recent Labs Lab 07/15/14 2202  WBC 8.2   NEUTROABS 5.4  HGB 13.4  HCT 39.4  MCV 87.9  PLT 219    Recent Labs Lab 07/16/14 0445  TROPONINI <0.30   No results found for this basename: LABPROT, INR,  in the last 72 hours  Recent Labs  07/16/14 0402  COLORURINE YELLOW  LABSPEC 1.014  PHURINE 5.0  GLUCOSEU NEGATIVE  HGBUR NEGATIVE  BILIRUBINUR NEGATIVE  KETONESUR NEGATIVE  PROTEINUR NEGATIVE  UROBILINOGEN 0.2  NITRITE NEGATIVE  LEUKOCYTESUR NEGATIVE    No results found for this basename: chol, trig, hdl, cholhdl, vldl, ldlcalc   No results found for this basename: HGBA1C      Component Value Date/Time   LABOPIA NONE DETECTED 07/16/2014 0410   COCAINSCRNUR NONE DETECTED 07/16/2014 0410   LABBENZ NONE DETECTED 07/16/2014 0410   AMPHETMU NONE DETECTED 07/16/2014 0410   THCU NONE DETECTED 07/16/2014 0410   LABBARB NONE DETECTED 07/16/2014 0410    No results found for this basename: ETH,  in the last 168 hours  Ct Head Wo Contrast  07/16/2014   CLINICAL DATA:  Left-sided numbness.  EXAM: CT HEAD WITHOUT CONTRAST  TECHNIQUE: Contiguous axial images were obtained from the base of the skull through the vertex without intravenous contrast.  COMPARISON:  None.  FINDINGS: Skull and Sinuses:There are secretions layering within the right sphenoid sinus. Opacification of the right mastoid tip, with sclerosis, most consistent  with chronic mastoiditis. Postinflammatory mucosal disease in the inferior right maxillary antrum.  Orbits: No acute abnormality.  Brain: No evidence of acute abnormality, such as acute infarction, hemorrhage, hydrocephalus, or mass lesion/mass effect. There is a halo of low-attenuation around the nondilated lateral ventricles. Although nonspecific, this is most consistent with chronic small vessel disease -especially in light of history of hypertension and diabetes. Age related generalized cerebral volume loss.  IMPRESSION: 1. No acute intracranial findings. 2. Mild chronic small vessel disease. 3. Right sphenoid  sinus effusion.   Electronically Signed   By: Tiburcio Pea M.D.   On: 07/16/2014 02:12   Mr Maxine Glenn Head Wo Contrast  07/16/2014   CLINICAL DATA:  LEFT-sided numbness and weakness beginning 07/15/2014 with speech difficulty. Partial improvement. Stroke risk factors include diabetes and hypertension. IV thrombolysis not given.  EXAM: MRI HEAD WITHOUT CONTRAST  MRA HEAD WITHOUT CONTRAST  TECHNIQUE: Multiplanar, multiecho pulse sequences of the brain and surrounding structures were obtained without intravenous contrast. Angiographic images of the head were obtained using MRA technique without contrast.  COMPARISON:  CT head earlier today is unremarkable.  FINDINGS: MRI HEAD FINDINGS  No evidence for acute infarction, hemorrhage, mass lesion, hydrocephalus, or extra-axial fluid. Normal for age cerebral volume. Mild to moderate subcortical and periventricular T2 and FLAIR hyperintensities, likely chronic microvascular ischemic change.  Flow voids are maintained. 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 These involve the supratentorial compartment as well as the cerebellum. The brainstem is spared. Cerebral amyloid angiopathy could have this appearance but is less favored in the absence of large vessel infarct or lobar hemorrhage. I am unable to identify an area suspicious for acute hemorrhage.  Empty sella, nonspecific finding. No tonsillar herniation. Upper cervical region poorly visualized. No acute sinus or mastoid disease. Grossly negative orbits.  MRA HEAD FINDINGS  The internal carotid arteries are widely patent. The basilar artery is widely patent with vertebrals codominant. There is no proximal intracranial stenosis or aneurysm. Minor irregularity distal MCA and PCA branches could suggest early intracranial atherosclerotic change.  IMPRESSION: No acute stroke is  evident.  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 Associated chronic microvascular ischemic change in the white matter.  Empty sella, nonspecific.  No proximal stenosis or occlusion of the intracranial circulation.   Electronically Signed   By: Davonna Belling M.D.   On: 07/16/2014 10:02   Mr Brain Wo Contrast  07/16/2014   CLINICAL DATA:  LEFT-sided numbness and weakness beginning 07/15/2014 with speech difficulty. Partial improvement. Stroke risk factors include diabetes and hypertension. IV thrombolysis not given.  EXAM: MRI HEAD WITHOUT CONTRAST  MRA HEAD WITHOUT CONTRAST  TECHNIQUE: Multiplanar, multiecho pulse sequences of the brain and surrounding structures were obtained without intravenous contrast. Angiographic images of the head were obtained using MRA technique without contrast.  COMPARISON:  CT head earlier today is unremarkable.  FINDINGS: MRI HEAD FINDINGS  No evidence for acute infarction, hemorrhage, mass lesion, hydrocephalus, or extra-axial fluid. Normal for age cerebral volume. Mild to moderate subcortical and periventricular T2 and FLAIR hyperintensities, likely chronic microvascular ischemic change.  Flow voids are maintained. 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 These involve the supratentorial compartment as well as the cerebellum. The brainstem is spared. Cerebral amyloid  angiopathy could have this appearance but is less favored in the absence of large vessel infarct or lobar hemorrhage. I am unable to identify an area suspicious for acute hemorrhage.  Empty sella, nonspecific finding. No tonsillar herniation. Upper cervical region poorly visualized. No acute sinus or mastoid disease. Grossly negative orbits.  MRA HEAD FINDINGS  The internal  carotid arteries are widely patent. The basilar artery is widely patent with vertebrals codominant. There is no proximal intracranial stenosis or aneurysm. Minor irregularity distal MCA and PCA branches could suggest early intracranial atherosclerotic change.  IMPRESSION: No acute stroke is evident.  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 Associated chronic microvascular ischemic change in the white matter.  Empty sella, nonspecific.  No proximal stenosis or occlusion of the intracranial circulation.   Electronically Signed   By: Davonna Belling M.D.   On: 07/16/2014 10:02     PHYSICAL EXAM Pleasant obese middle aged african american lady not in distress.Awake alert. Afebrile. Head is nontraumatic. Neck is supple without bruit. Hearing is normal. Cardiac exam no murmur or gallop. Lungs are clear to auscultation. Distal pulses are well felt. Neurological Exam :  Awake alert oriented x 3 normal language but mild dysarthria. Mild left lower face asymmetry. Tongue midline. No drift. Mild diminished fine finger movements on left. Orbits right over left upper extremity. Mild left grip weak.. Normal sensation . Normal coordination. NIHSS 2 ASSESSMENT/PLAN  Ms. Christy Potter is a 58 y.o. female with history of hypertension and diabetes presenting with left sided numbness-weakness, left face weakness, dysarthria. She did not receive IV t-PA due to delay in arrival. MRI imaging confirms no acute infarct.   R brain TIA     aspirin 81 mg orally every day prior to admission, now on aspirin 325 mg orally every day. Patient interested in considering [employer] research study for secondary stroke prevention. Dr. Pearlean Brownie spoke with patient and family. [facility] will follow up.  MRI  No infarct  MRA  No large vessel stenosis  Carotid Doppler  pending   2D Echo  pending   Heparin  5000 units sq tid for VTE prophylaxis  Carb Control thin liquids.   Bedrest  Therapy recommendations:  pending   Ongoing aggressive risk factor management  Risk factor education  Disposition:  Anticipate return home  Hypertension   Permissive hypertension <220/120 for 24-48 hours and then gradually normalize within 5-7 days  BP goal long term normotensive  Home meds:  Amlodipine, benazapril, coreg, catapress, lasix, micardis HCT.  BP 118-179/60-89 past 24h (07/16/2014 @ 11:10 AM) Stable   Hyperlipidemia  Home meds:  No statin listed. lipitor 40 as been added   LDL .pending    LDL goal < 100 (<70 for diabetics)  Diabetes  HgbA1c pending    Uncontrolled  Goal < 7.0  Other Stroke Risk Factors   Morbid obesity, Body mass index is 53.32 kg/(m^2).  Obstructive sleep apnea, on CPAP at home  Hospital day # 0  Annie Main, MSN, RN, ANVP-BC, ANP-BC, [facility] Pager: [phone] 07/16/2014 2:54 PM   I have personally examined this patient, reviewed notes, independently viewed imaging studies, participated in medical decision making and plan of care. I have made any additions or clarifications directly to the above note. Agree with note above. Patient is at significant risk for neurological worsening and recurrent strokes. His need for it hospitalization and stroke risk stratification evaluation and  treatment. She may possibly participate in the Socrates trial for stroke prevention  If interested  Delia Heady, MD Medical Director [facility] Pager: [phone] 07/16/2014 3:30 PM   To contact Stroke Continuity provider, please refer to [email or web address]. After hours, contact General Neurology

## 2014-07-17 LAB — CBC
HCT: 38.8 % (ref 36.0–46.0)
HEMOGLOBIN: 13 g/dL (ref 12.0–15.0)
MCH: 29.8 pg (ref 26.0–34.0)
MCHC: 33.5 g/dL (ref 30.0–36.0)
MCV: 89 fL (ref 78.0–100.0)
Platelets: 220 10*3/uL (ref 150–400)
RBC: 4.36 MIL/uL (ref 3.87–5.11)
RDW: 13.9 % (ref 11.5–15.5)
WBC: 7.5 10*3/uL (ref 4.0–10.5)

## 2014-07-17 LAB — BASIC METABOLIC PANEL
Anion gap: 11 (ref 5–15)
BUN: 11 mg/dL (ref 6–23)
CO2: 32 mEq/L (ref 19–32)
CREATININE: 0.6 mg/dL (ref 0.50–1.10)
Calcium: 9.1 mg/dL (ref 8.4–10.5)
Chloride: 100 mEq/L (ref 96–112)
GFR calc non Af Amer: >90 mL/min (ref >90)
Glucose, Bld: 235 mg/dL — ABNORMAL HIGH (ref 70–99)
Potassium: 3.5 mEq/L — ABNORMAL LOW (ref 3.7–5.3)
Sodium: 143 mEq/L (ref 137–147)

## 2014-07-17 LAB — LIPID PANEL
Cholesterol: 204 mg/dL — ABNORMAL HIGH (ref 0–200)
HDL: 51 mg/dL (ref >39)
LDL Cholesterol: 116 mg/dL — ABNORMAL HIGH (ref 0–99)
TRIGLYCERIDES: 185 mg/dL — AB (ref <150)
Total CHOL/HDL Ratio: 4 RATIO
VLDL: 37 mg/dL (ref 0–40)

## 2014-07-17 LAB — GLUCOSE, CAPILLARY
GLUCOSE-CAPILLARY: 229 mg/dL — AB (ref 70–99)
Glucose-Capillary: 183 mg/dL — ABNORMAL HIGH (ref 70–99)
Glucose-Capillary: 200 mg/dL — ABNORMAL HIGH (ref 70–99)

## 2014-07-17 MED ORDER — PNEUMOCOCCAL VAC POLYVALENT 25 MCG/0.5ML IJ INJ: 0.5000 mL | INJECTION | INTRAMUSCULAR | Status: DC

## 2014-07-17 MED ORDER — AMLODIPINE BESY-BENAZEPRIL HCL 10-40 MG PO CAPS: 1.0000 | ORAL_CAPSULE | Freq: Every day | ORAL | Status: DC

## 2014-07-17 MED ORDER — GLIMEPIRIDE 4 MG PO TABS: 4.0000 mg | ORAL_TABLET | Freq: Every day | ORAL | Status: DC

## 2014-07-17 MED ORDER — LIVING WELL WITH DIABETES BOOK
Freq: Once | Status: AC
  Administered 2014-07-17: 17:00:00
  Filled 2014-07-17: qty 1

## 2014-07-17 MED ORDER — POTASSIUM CHLORIDE CRYS ER 20 MEQ PO TBCR
40.0000 meq | EXTENDED_RELEASE_TABLET | Freq: Once | ORAL | Status: AC
  Administered 2014-07-17: 40 meq via ORAL
  Filled 2014-07-17: qty 2

## 2014-07-17 MED ORDER — BENAZEPRIL HCL 20 MG PO TABS
40.0000 mg | ORAL_TABLET | Freq: Every day | ORAL | Status: DC
  Administered 2014-07-17: 40 mg via ORAL
  Filled 2014-07-17: qty 2

## 2014-07-17 MED ORDER — ATORVASTATIN CALCIUM 40 MG PO TABS: 40.0000 mg | ORAL_TABLET | Freq: Every day | ORAL | Status: AC

## 2014-07-17 MED ORDER — STUDY - INVESTIGATIONAL DRUG SIMPLE RECORD: 90.0000 mg | Freq: Two times a day (BID) | Status: DC

## 2014-07-17 MED ORDER — AMLODIPINE BESYLATE 10 MG PO TABS
10.0000 mg | ORAL_TABLET | Freq: Every day | ORAL | Status: DC
  Administered 2014-07-17: 10 mg via ORAL
  Filled 2014-07-17: qty 1

## 2014-07-17 MED ORDER — INFLUENZA VAC SPLIT QUAD 0.5 ML IM SUSY: 0.5000 mL | PREFILLED_SYRINGE | INTRAMUSCULAR | Status: DC

## 2014-07-17 MED ORDER — STUDY - INVESTIGATIONAL DRUG SIMPLE RECORD: 100.0000 mg | Freq: Every day | Status: DC

## 2014-07-17 NOTE — Progress Notes (Signed)
*  PRELIMINARY RESULTS* Vascular Ultrasound Carotid Duplex (Doppler) has been completed.   Findings suggest 1-39% internal carotid artery stenosis bilaterally. Vertebral arteries are patent with antegrade flow.  07/17/2014 12:08 PM Gertie Fey, RVT, RDCS, RDMS

## 2014-07-17 NOTE — Progress Notes (Signed)
D/C orders received. Pt educated on d/c instructions and stroke education. Verbalized understanding. D/c packet and prescriptions handed to pt. IV and tele removed. Pt taken downstairs by staff via wheelchair.

## 2014-07-17 NOTE — Progress Notes (Addendum)
PT Evaluation  Cancellation Note  Patient Details Name: Christy Potter MRN: [id number] DOB: 04/18/1956   Cancelled Treatment:    Reason Eval/Treat Not Completed: PT screened, no needs identified, will sign off. 07/17/2014  [place] Bing, PT [phone]  (pager)   Townsend Cudworth, Eliseo Gum 07/17/2014, 11:07 AM

## 2014-07-17 NOTE — Discharge Summary (Signed)
Physician Discharge Summary  Patient ID: Christy Potter MRN: [id number] DOB/AGE: 23-May-1956 58 y.o.  Admit date: 07/16/2014 Discharge date: 07/17/2014  Primary Care Physician:  Allyson Sabal, MD  Discharge Diagnoses:    . TIA (transient ischemic attack) . Diabetes mellitus without complication . Hypertension . OSA on CPAP  Consults:  Neurology, Dr. Pearlean Brownie   Recommendations for Outpatient Follow-up:  Patient needs to have aggressive risk factor modifications, better control of her diabetes and hypertension  Hemoglobin A1c 10.7, patient wants to stay on oral hypoglycemic pills, added Amaryl, please check hemoglobin A1c in 3 months. Patient will benefit from outpatient diabetic classes for education.  Allergies:  No Known Allergies   Discharge Medications:   Medication List         Alogliptin-Metformin HCl 12.02-999 MG Tabs  Take 1 tablet by mouth 2 (two) times daily with a meal.     amLODipine-benazepril 10-40 MG per capsule  Commonly known as:  LOTREL  Take 1 capsule by mouth daily.     atorvastatin 40 MG tablet  Commonly known as:  LIPITOR  Take 1 tablet (40 mg total) by mouth at bedtime.     carvedilol 25 MG tablet  Commonly known as:  COREG  Take 25 mg by mouth 2 (two) times daily with a meal.     celecoxib 200 MG capsule  Commonly known as:  CELEBREX  Take 200 mg by mouth at bedtime.     cloNIDine 0.1 MG tablet  Commonly known as:  CATAPRES  Take 0.1 mg by mouth 2 (two) times daily.     furosemide 40 MG tablet  Commonly known as:  LASIX  Take 40 mg by mouth daily.     glimepiride 4 MG tablet  Commonly known as:  AMARYL  Take 1 tablet (4 mg total) by mouth daily with breakfast.     potassium chloride SA 20 MEQ tablet  Commonly known as:  K-DUR,KLOR-CON  Take 20 mEq by mouth daily.     pregabalin 50 MG capsule  Commonly known as:  LYRICA  Take 50 mg by mouth at bedtime.     research study medication  Take 100 mg by mouth daily.     research  study medication  Take 90 mg by mouth 2 (two) times daily.     telmisartan-hydrochlorothiazide 80-25 MG per tablet  Commonly known as:  MICARDIS HCT  Take 1 tablet by mouth daily.     traMADol 50 MG tablet  Commonly known as:  ULTRAM  Take 50 mg by mouth daily as needed for moderate pain.         Brief H and P: For complete details please refer to admission H and P, but in brief Christy Potter is a 58 y.o. female with past medical history of HTN, DM, OSA, who presents with left sided numbness and weakness.  Patient reported that while she was working a day prior to admission, she started feeling numb and weak in her left side face, arm and leg at about 9:00 PM. She was noticed to have slurred speech by her colleagues. She had mild headache, but did not have change of hearing or vision. No fever, chills, cough, chest pain, shortness of breath, nausea, vomiting, abdominal pain, dysuria. She was brought to ED by her cousin who is her coworker. Her symptoms resolved after she arrived to ED. CT brain showed no acute intracranial findings  Hospital Course:   Numbness on the left side with slurred speech likely due to  TIA (transient ischemic attack) : Resolved. Patient was admitted for further workup. MRI of the brain was done which showed no acute CVA. MRA showed no large vessel stenosis  Carotid Dopplers showed 1-39% ICA stenosis bilaterally. 2-D echo showed EF of 55-60%, grade 1 diastolic dysfunction. Hemoglobin A1c 10.7. I discussed in detail with the patient regarding diabetes and she wanted to stay on oral hypoglycemics. She will continue alogliptin-metformin, I also added Amaryl with breakfast. Patient was placed on statins, LDL 116 The patient was enrolled in [employer] research study by neurology service. She will follow up outpatient with Dr. Pearlean Brownie in 2 months   Diabetes mellitus without complication  Uncontrolled, hemoglobin A1c 10.7. I discussed in detail with the patient regarding  diabetes and she wanted to stay on oral hypoglycemics. Amaryl 4 mg daily was added. Recheck hemoglobin A1c in 3 months  Hypertension : Needs to be better controlled outpatient, and she was restarted on all her antihypertensives.  OSA on CPAP   Obesity  - Counseled on diet and weight control    Day of Discharge BP 179/75  Pulse 72  Temp(Src) 97.9 F (36.6 C) (Oral)  Resp 20  Ht 5' (1.524 m)  Wt 123.832 kg (273 lb)  BMI 53.32 kg/m2  SpO2 98%  Physical Exam: General: Alert and awake oriented x3 not in any acute distress. CVS: S1-S2 clear no murmur rubs or gallops Chest: clear to auscultation bilaterally, no wheezing rales or rhonchi Abdomen: soft nontender, nondistended, normal bowel sounds Extremities: no cyanosis, clubbing or edema noted bilaterally Neuro: Cranial nerves II-XII intact, no focal neurological deficits   The results of significant diagnostics from this hospitalization (including imaging, microbiology, ancillary and laboratory) are listed below for reference.    LAB RESULTS: Basic Metabolic Panel:  Recent Labs Lab 07/16/14 0445 07/17/14 0808  NA 141 143  K 3.4* 3.5*  CL 99 100  CO2 27 32  GLUCOSE 233* 235*  BUN 16 11  CREATININE 0.81 0.60  CALCIUM 8.6 9.1   Liver Function Tests:  Recent Labs Lab 07/15/14 2202  AST 18  ALT 16  ALKPHOS 77  BILITOT 0.7  PROT 7.4  ALBUMIN 3.6   No results found for this basename: LIPASE, AMYLASE,  in the last 168 hours No results found for this basename: AMMONIA,  in the last 168 hours CBC:  Recent Labs Lab 07/15/14 2202 07/17/14 0808  WBC 8.2 7.5  NEUTROABS 5.4  --   HGB 13.4 13.0  HCT 39.4 38.8  MCV 87.9 89.0  PLT 219 220   Cardiac Enzymes:  Recent Labs Lab 07/16/14 1043 07/16/14 1550  TROPONINI <0.30 <0.30   BNP: No components found with this basename: POCBNP,  CBG:  Recent Labs Lab 07/17/14 0657 07/17/14 1146  GLUCAP 183* 229*    Significant Diagnostic Studies:  Ct Head Wo  Contrast  07/16/2014   CLINICAL DATA:  Left-sided numbness.  EXAM: CT HEAD WITHOUT CONTRAST  TECHNIQUE: Contiguous axial images were obtained from the base of the skull through the vertex without intravenous contrast.  COMPARISON:  None.  FINDINGS: Skull and Sinuses:There are secretions layering within the right sphenoid sinus. Opacification of the right mastoid tip, with sclerosis, most consistent with chronic mastoiditis. Postinflammatory mucosal disease in the inferior right maxillary antrum.  Orbits: No acute abnormality.  Brain: No evidence of acute abnormality, such as acute infarction, hemorrhage, hydrocephalus, or mass lesion/mass effect. There is a halo of low-attenuation around the nondilated lateral ventricles. Although nonspecific, this is most consistent  with chronic small vessel disease -especially in light of history of hypertension and diabetes. Age related generalized cerebral volume loss.  IMPRESSION: 1. No acute intracranial findings. 2. Mild chronic small vessel disease. 3. Right sphenoid sinus effusion.   Electronically Signed   By: Tiburcio Pea M.D.   On: 07/16/2014 02:12   Mr Maxine Glenn Head Wo Contrast  07/16/2014   CLINICAL DATA:  LEFT-sided numbness and weakness beginning 07/15/2014 with speech difficulty. Partial improvement. Stroke risk factors include diabetes and hypertension. IV thrombolysis not given.  EXAM: MRI HEAD WITHOUT CONTRAST  MRA HEAD WITHOUT CONTRAST  TECHNIQUE: Multiplanar, multiecho pulse sequences of the brain and surrounding structures were obtained without intravenous contrast. Angiographic images of the head were obtained using MRA technique without contrast.  COMPARISON:  CT head earlier today is unremarkable.  FINDINGS: MRI HEAD FINDINGS  No evidence for acute infarction, hemorrhage, mass lesion, hydrocephalus, or extra-axial fluid. Normal for age cerebral volume. Mild to moderate subcortical and periventricular T2 and FLAIR hyperintensities, likely chronic  microvascular ischemic change.  Flow voids are maintained. 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 These involve the supratentorial compartment as well as the cerebellum. The brainstem is spared. Cerebral amyloid angiopathy could have this appearance but is less favored in the absence of large vessel infarct or lobar hemorrhage. I am unable to identify an area suspicious for acute hemorrhage.  Empty sella, nonspecific finding. No tonsillar herniation. Upper cervical region poorly visualized. No acute sinus or mastoid disease. Grossly negative orbits.  MRA HEAD FINDINGS  The internal carotid arteries are widely patent. The basilar artery is widely patent with vertebrals codominant. There is no proximal intracranial stenosis or aneurysm. Minor irregularity distal MCA and PCA branches could suggest early intracranial atherosclerotic change.  IMPRESSION: No acute stroke is evident.  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 Associated chronic microvascular ischemic change in the white matter.  Empty sella, nonspecific.  No proximal stenosis or occlusion of the intracranial circulation.   Electronically Signed   By: Davonna Belling M.D.   On: 07/16/2014 10:02   Mr Brain Wo Contrast  07/16/2014   CLINICAL DATA:  LEFT-sided numbness and weakness beginning 07/15/2014 with speech difficulty. Partial improvement. Stroke risk factors include diabetes and hypertension. IV thrombolysis not given.  EXAM: MRI HEAD WITHOUT CONTRAST  MRA HEAD WITHOUT CONTRAST  TECHNIQUE: Multiplanar, multiecho pulse sequences of the brain and surrounding structures were obtained without intravenous contrast. Angiographic images of the head were obtained using MRA technique without contrast.  COMPARISON:   CT head earlier today is unremarkable.  FINDINGS: MRI HEAD FINDINGS  No evidence for acute infarction, hemorrhage, mass lesion, hydrocephalus, or extra-axial fluid. Normal for age cerebral volume. Mild to moderate subcortical and periventricular T2 and FLAIR hyperintensities, likely chronic microvascular ischemic change.  Flow voids are maintained. 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 These involve the supratentorial compartment as well as the cerebellum. The brainstem is spared. Cerebral amyloid angiopathy could have this appearance but is less favored in the absence of large vessel infarct or lobar hemorrhage. I am unable to identify an area suspicious for acute hemorrhage.  Empty sella, nonspecific finding. No tonsillar herniation. Upper cervical region poorly visualized. No acute sinus or mastoid disease. Grossly negative  orbits.  MRA HEAD FINDINGS  The internal carotid arteries are widely patent. The basilar artery is widely patent with vertebrals codominant. There is no proximal intracranial stenosis or aneurysm. Minor irregularity distal MCA and PCA branches could suggest early intracranial atherosclerotic change.  IMPRESSION: No acute stroke is evident.  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 Associated chronic microvascular ischemic change in the white matter.  Empty sella, nonspecific.  No proximal stenosis or occlusion of the intracranial circulation.   Electronically Signed   By: Davonna Belling M.D.   On: 07/16/2014 10:02    2D ECHO:  Study Conclusions  - Left ventricle: E/e&'>16.9 suggestive of elevated LV filling pressures. The cavity size was normal. Systolic function was normal. The estimated ejection fraction was in the range of 55% to 60%.  Wall motion was normal; there were no regional wall motion abnormalities. There was an increased relative contribution of atrial contraction to ventricular filling. Doppler parameters are consistent with abnormal left ventricular relaxation (grade 1 diastolic dysfunction). - Left atrium: The atrium was moderately dilated. - Pulmonic valve: There was trivial regurgitation.   Disposition and Follow-up:     Discharge Instructions   Diet Carb Modified    Complete by:  As directed      Increase activity slowly    Complete by:  As directed             DISPOSITION: Home  DIET: Carb modified     DISCHARGE FOLLOW-UP Follow-up Information   [facility] with [facility], [facility]. Schedule an appointment as soon as possible for a visit in 2 weeks. (for hospital follow-up)    Specialty:  Family Medicine      Follow up with SETHI,PRAMOD, MD. Schedule an appointment as soon as possible for a visit in 2 months. (for hospital follow-up)    Specialties:  Neurology, Radiology   Contact information:   [address] [place] [address] [phone]       Time spent on Discharge: 45 minutes  Signed:   Rj Pedrosa M.D. [facility] 07/17/2014, 1:18 PM Pager: [phone]   **Disclaimer: This note was dictated with voice recognition software. Similar sounding words can inadvertently be transcribed and this note may contain transcription errors which may not have been corrected upon publication of note.**

## 2014-07-17 NOTE — Evaluation (Signed)
Speech Language Pathology Evaluation Patient Details Name: Christy Potter MRN: [id number] DOB: [birth year] Today's Date: 07/17/2014 Time: 5621-3086 SLP Time Calculation (min): 55 min  Problem List:  Patient Active Problem List   Diagnosis Date Noted  . TIA (transient ischemic attack) 07/16/2014  . Stroke-like symptom 07/16/2014  . OSA on CPAP 07/16/2014  . Diabetes mellitus without complication   . Hypertension    Past Medical History:  Past Medical History  Diagnosis Date  . Diabetes mellitus without complication   . Hypertension    Past Surgical History: History reviewed. No pertinent past surgical history. HPI:  58 yo female adm to [facility] with dizziness, facial droop and expressive language deficits.   Imaging studies negative and subsequently pt diagnosed with TIA.  Speech evaluation ordered.  Pt resides with her son and works for [employer] night shift.     Assessment / Plan / Recommendation Clinical Impression  Pt presents with attention and memory deficits, she recalls telling her son approximately one week ago that she was "Not right".  Pt required category/multiple choice cues to name 5 of 5 items- unable to recall any independently.  She did not recall home medications except for one, which she states is different than baseline.  She did recall important health information Dr Pearlean Brownie had shared with her after her session.  Pt with fluent speech/language skills - able to name 12 category items within 60 seconds.  Skilled intervention including education and giving pt written and verbal compensation strategies to improve attention and compensate for memory difficulties.  As all education is completed, no further SLP warranted.  Pt in agreement.     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  Overall Cognitive Status: Impaired/Different from baseline Arousal/Alertness: Awake/alert Orientation Level: Oriented X4 Attention: Focused;Sustained;Selective Focused Attention: Appears intact Sustained Attention: Appears intact Selective Attention: Impaired Memory: Impaired (did not recall 5/5 of words independently, cues needed for all ) Memory Impairment: Storage deficit;Retrieval deficit (pt did recall information from Dr Pearlean Brownie re: follow up plans) Awareness: Appears intact Problem Solving: Appears intact (receptive to slp providing compensation strategies for memory and attention deficits ) Safety/Judgment: Appears intact    Comprehension  Auditory Comprehension Overall Auditory Comprehension: Appears within functional limits for tasks assessed Yes/No Questions: Not tested Commands: Within Functional Limits Conversation: Complex Visual Recognition/Discrimination Discrimination: Not tested Reading Comprehension Reading Status: Within funtional limits    Expression Expression Primary Mode of Expression: Verbal Verbal Expression Overall Verbal Expression: Appears within functional limits for tasks assessed Initiation: No impairment Repetition: No impairment Naming: Not tested Pragmatics: No impairment Written Expression Dominant Hand: Right Written Expression: Not tested   Oral / Motor Oral Motor/Sensory Function Overall Oral Motor/Sensory Function: Appears within functional limits for tasks assessed Motor Speech Overall Motor Speech: Appears within functional limits for tasks assessed Respiration: Within functional  limits Phonation: Normal Resonance: Within functional limits Articulation: Within functional limitis Intelligibility: Intelligible Motor Planning: Witnin functional limits Motor Speech Errors: Not applicable Interfering  Components: Premorbid status   GO     Mills Koller, MS [facility] SLP [phone]

## 2014-07-17 NOTE — Evaluation (Signed)
Occupational Therapy Evaluation Patient Details Name: Christy Potter MRN: [id number] DOB: [birth year] Today's Date: 07/17/2014    History of Present Illness 58 yo female admitted with LT side weakness and numbness. CT (-) MRI (-) PMH: HTN DM   Clinical Impression   Patient evaluated by Occupational Therapy with no further acute OT needs identified. All education has been completed and the patient has no further questions. See below for any follow-up Occupational Therapy or equipment needs. OT to sign off. Thank you for referral.      Follow Up Recommendations  No OT follow up    Equipment Recommendations  None recommended by OT    Recommendations for Other Services       Precautions / Restrictions Precautions Precautions: None      Mobility Bed Mobility               General bed mobility comments: in chair on arrival  Transfers Overall transfer level: Independent                    Balance Overall balance assessment: No apparent balance deficits (not formally assessed)                                          ADL Overall ADL's : Modified independent                                       General ADL Comments: baseline has son help don socks. pt reports LE edema at times. Pt currently without edema. Pt completed chair transfer, toilet transfer, sink adl, tub transfer sitting in the bottom of tub and > 75 ft ambulation. pt walking through narrowed space without gait disturbance.     Vision                     Perception     Praxis      Pertinent Vitals/Pain Pain Assessment: No/denies pain     Hand Dominance Right   Extremity/Trunk Assessment Upper Extremity Assessment Upper Extremity Assessment: Overall WFL for tasks assessed   Lower Extremity Assessment Lower Extremity Assessment: Overall WFL for tasks assessed   Cervical / Trunk Assessment Cervical / Trunk Assessment: Normal   Communication  Communication Communication: No difficulties   Cognition Arousal/Alertness: Awake/alert Behavior During Therapy: WFL for tasks assessed/performed Overall Cognitive Status: Within Functional Limits for tasks assessed                     General Comments       Exercises       Shoulder Instructions      Home Living Family/patient expects to be discharged to:: Private residence Living Arrangements: Children Available Help at Discharge: Family Type of Home: House Home Access: Stairs to enter Secretary/administrator of Steps: 1   Home Layout: One level     Bathroom Shower/Tub: Chief Strategy Officer: Standard     Home Equipment: Medical laboratory scientific officer - quad   Additional Comments: lives with disabiled son. Son is able to go shopping , basic house choices and helps don pt socks daily      Prior Functioning/Environment Level of Independence: Independent        Comments: works full time    OT Diagnosis:  OT Problem List:     OT Treatment/Interventions:      OT Goals(Current goals can be found in the care plan section) Acute Rehab OT Goals Patient Stated Goal: to return home and to work   OT Frequency:     Barriers to D/C:            Co-evaluation              End of Session Nurse Communication: Mobility status;Precautions  Activity Tolerance: Patient tolerated treatment well Patient left: in chair;with call bell/phone within reach;with chair alarm set (sign off on mobility)   Time: 1610-9604 OT Time Calculation (min): 11 min Charges:  OT General Charges $OT Visit: 1 Procedure OT Evaluation $Initial OT Evaluation Tier I: 1 Procedure OT Treatments $Self Care/Home Management : 8-22 mins G-Codes:    Christy Potter 2014/07/19, 9:32 AM Pager: [phone]

## 2014-07-17 NOTE — Progress Notes (Signed)
STROKE TEAM PROGRESS NOTE   HISTORY Christy Potter is an 58 y.o. female with a past medical history significant for HTN, DM, comes in accompanied by her family for further evaluation of left sided weakness and numbness. She said that she was doing well until approximately 8:55 pm last night 07/15/2014 when she developed gradual onset of numbness-tingling of the left face travelling to the left arm and leg and also noticed heaviness/weakness of the left leg to the point that she couldn't put weight on it. She said that her coworkers noticed that she was slurring her words and the left side of her face was droopier than the right. Had some HA afterwards but denies vertigo, double vision, difficulty swallowing, confusion, vision changes, chest pain, SOB, or palpitations. Takes aspirin 81 mg daily. Daughter is at the bedside and said that the speech is significantly improved but the left face is little bit droopy. CT brain showed no acute intracranial findings. Patient was not administered TPA secondary to delay in arrival. She is to be admitted to  for further evaluation and treatment.   SUBJECTIVE (INTERVAL HISTORY) No family, friends at bedside. She has had no neuro changes. She is excited about possibility of going home later today.  OBJECTIVE Temp:  [97.5 F (36.4 C)-99 F (37.2 C)] 97.9 F (36.6 C) (09/25 0946) Pulse Rate:  [63-76] 72 (09/25 0946) Cardiac Rhythm:  [-]  Resp:  [16-20] 20 (09/25 0946) BP: (121-179)/(52-126) 179/75 mmHg (09/25 0946) SpO2:  [93 %-99 %] 98 % (09/25 0946)  CBG (last 3)   Recent Labs  07/16/14 1632 07/16/14 2130 07/17/14 0657  GLUCAP 199* 187* 183*    Recent Labs Lab 07/15/14 2202 07/16/14 0445 07/17/14 0808  NA 142 141 143  K 3.5* 3.4* 3.5*  CL 98 99 100  CO2 29 27 32  GLUCOSE 251* 233* 235*  BUN CREATININE 0.80 0.81 0.60  CALCIUM 8.9 8.6 9.1    Recent Labs Lab 07/15/14 2202  AST 18  ALT 16  ALKPHOS 77  BILITOT 0.7  PROT 7.4   ALBUMIN 3.6    Recent Labs Lab 07/15/14 2202 07/17/14 0808  WBC 8.2 7.5  NEUTROABS 5.4  --   HGB 13.4 13.0  HCT 39.4 38.8  MCV 87.9 89.0  PLT 219 220    Recent Labs Lab 07/16/14 0445 07/16/14 1043 07/16/14 1550  TROPONINI <0.30 <0.30 <0.30   No results found for this basename: LABPROT, INR,  in the last 72 hours  Recent Labs  07/16/14 0402  COLORURINE YELLOW  LABSPEC 1.014  PHURINE 5.0  GLUCOSEU NEGATIVE  HGBUR NEGATIVE  BILIRUBINUR NEGATIVE  KETONESUR NEGATIVE  PROTEINUR NEGATIVE  UROBILINOGEN 0.2  NITRITE NEGATIVE  LEUKOCYTESUR NEGATIVE    Lipid Panel     Component Value Date/Time   CHOL 204* 07/17/2014 0808   TRIG 185* 07/17/2014 0808   HDL 51 07/17/2014 0808   CHOLHDL 4.0 07/17/2014 0808   VLDL 37 07/17/2014 0808   LDLCALC 116* 07/17/2014 0808    Lab Results  Component Value Date   HGBA1C 10.7* 07/16/2014      Component Value Date/Time   LABOPIA NONE DETECTED 07/16/2014 0410   COCAINSCRNUR NONE DETECTED 07/16/2014 0410   LABBENZ NONE DETECTED 07/16/2014 0410   AMPHETMU NONE DETECTED 07/16/2014 0410   THCU NONE DETECTED 07/16/2014 0410   LABBARB NONE DETECTED 07/16/2014 0410    No results found for this basename: ETH,  in the last 168 hours  Ct Head  Wo Contrast  07/16/2014   CLINICAL DATA:  Left-sided numbness.  EXAM: CT HEAD WITHOUT CONTRAST  TECHNIQUE: Contiguous axial images were obtained from the base of the skull through the vertex without intravenous contrast.  COMPARISON:  None.  FINDINGS: Skull and Sinuses:There are secretions layering within the right sphenoid sinus. Opacification of the right mastoid tip, with sclerosis, most consistent with chronic mastoiditis. Postinflammatory mucosal disease in the inferior right maxillary antrum.  Orbits: No acute abnormality.  Brain: No evidence of acute abnormality, such as acute infarction, hemorrhage, hydrocephalus, or mass lesion/mass effect. There is a halo of low-attenuation around the nondilated lateral  ventricles. Although nonspecific, this is most consistent with chronic small vessel disease -especially in light of history of hypertension and diabetes. Age related generalized cerebral volume loss.  IMPRESSION: 1. No acute intracranial findings. 2. Mild chronic small vessel disease. 3. Right sphenoid sinus effusion.   Electronically Signed   By: Tiburcio Pea M.D.   On: 07/16/2014 02:12   Mr Maxine Glenn Head Wo Contrast  07/16/2014   CLINICAL DATA:  LEFT-sided numbness and weakness beginning 07/15/2014 with speech difficulty. Partial improvement. Stroke risk factors include diabetes and hypertension. IV thrombolysis not given.  EXAM: MRI HEAD WITHOUT CONTRAST  MRA HEAD WITHOUT CONTRAST  TECHNIQUE: Multiplanar, multiecho pulse sequences of the brain and surrounding structures were obtained without intravenous contrast. Angiographic images of the head were obtained using MRA technique without contrast.  COMPARISON:  CT head earlier today is unremarkable.  FINDINGS: MRI HEAD FINDINGS  No evidence for acute infarction, hemorrhage, mass lesion, hydrocephalus, or extra-axial fluid. Normal for age cerebral volume. Mild to moderate subcortical and periventricular T2 and FLAIR hyperintensities, likely chronic microvascular ischemic change.  Flow voids are maintained. 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 These involve the supratentorial compartment as well as the cerebellum. The brainstem is spared. Cerebral amyloid angiopathy could have this appearance but is less favored in the absence of large vessel infarct or lobar hemorrhage. I am unable to identify an area suspicious for acute hemorrhage.  Empty sella, nonspecific finding. No tonsillar herniation. Upper cervical region poorly visualized. No acute sinus or mastoid disease. Grossly negative orbits.  MRA HEAD FINDINGS  The  internal carotid arteries are widely patent. The basilar artery is widely patent with vertebrals codominant. There is no proximal intracranial stenosis or aneurysm. Minor irregularity distal MCA and PCA branches could suggest early intracranial atherosclerotic change.  IMPRESSION: No acute stroke is evident.  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 Associated chronic microvascular ischemic change in the white matter.  Empty sella, nonspecific.  No proximal stenosis or occlusion of the intracranial circulation.   Electronically Signed   By: Davonna Belling M.D.   On: 07/16/2014 10:02   Mr Brain Wo Contrast  07/16/2014   CLINICAL DATA:  LEFT-sided numbness and weakness beginning 07/15/2014 with speech difficulty. Partial improvement. Stroke risk factors include diabetes and hypertension. IV thrombolysis not given.  EXAM: MRI HEAD WITHOUT CONTRAST  MRA HEAD WITHOUT CONTRAST  TECHNIQUE: Multiplanar, multiecho pulse sequences of the brain and surrounding structures were obtained without intravenous contrast. Angiographic images of the head were obtained using MRA technique without contrast.  COMPARISON:  CT head earlier today is unremarkable.  FINDINGS: MRI HEAD FINDINGS  No evidence for acute infarction, hemorrhage, mass lesion, hydrocephalus, or extra-axial fluid. Normal for  age cerebral volume. Mild to moderate subcortical and periventricular T2 and FLAIR hyperintensities, likely chronic microvascular ischemic change.  Flow voids are maintained. 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 These involve the supratentorial compartment as well as the cerebellum. The brainstem is spared. Cerebral amyloid angiopathy could have this appearance but is less favored in the absence of  large vessel infarct or lobar hemorrhage. I am unable to identify an area suspicious for acute hemorrhage.  Empty sella, nonspecific finding. No tonsillar herniation. Upper cervical region poorly visualized. No acute sinus or mastoid disease. Grossly negative orbits.  MRA HEAD FINDINGS  The internal carotid arteries are widely patent. The basilar artery is widely patent with vertebrals codominant. There is no proximal intracranial stenosis or aneurysm. Minor irregularity distal MCA and PCA branches could suggest early intracranial atherosclerotic change.  IMPRESSION: No acute stroke is evident.  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 Associated chronic microvascular ischemic change in the white matter.  Empty sella, nonspecific.  No proximal stenosis or occlusion of the intracranial circulation.   Electronically Signed   By: Davonna Belling M.D.   On: 07/16/2014 10:02    PHYSICAL EXAM Pleasant obese middle aged african american lady not in distress.Awake alert. Afebrile. Head is nontraumatic. Neck is supple without bruit. Hearing is normal. Cardiac exam no murmur or gallop. Lungs are clear to auscultation. Distal pulses are well felt. Neurological Exam :  Awake alert oriented x 3 normal language but mild dysarthria. Mild left lower face asymmetry. Tongue midline. No drift. Mild diminished fine finger movements on left. Orbits right over left upper extremity. Mild left grip weak.. Normal sensation . Normal coordination. NIHSS 2 ASSESSMENT/PLAN  Christy Potter is a 58 y.o. female with history of hypertension and diabetes presenting with left sided numbness-weakness, left face weakness, dysarthria. She did not receive IV t-PA due to delay in arrival. MRI imaging confirms no acute infarct.   R brain TIA, transient left hemiparesis and facial weakness now resolved  aspirin 81 mg orally every day prior  to admission, now enrolled in [employer] research study. Discharge home with medications from our pharmacy. Order has been written. Socrates is a comparison of 90-day treatment with ticagrelor vs aspirin for the prevention of major vascular events in patients with acute ischaemic stroke or TIA. Please contact [facility] at [phone] for any questions.  MRI  No infarct  MRA  No large vessel stenosis  Carotid Doppler  Pending   2D Echo  No embolic source  Heparin 5000 units sq tid for VTE prophylaxis  Carb Control thin liquids.   OOB with assistance  Therapy recommendations:  No needs  Ongoing aggressive risk factor management  Risk factor education  Disposition:  Anticipate return home today  Follow up Dr. Pearlean Brownie, stroke clinic, in 2 months.  Hypertension   Permissive hypertension <220/120 for 24-48 hours and then gradually normalize within 5-7 days  BP goal long term normotensive  Home meds:  Amlodipine, benazapril, coreg, catapress, lasix, micardis HCT.  Stable   Hyperlipidemia  Home meds:  No statin listed. lipitor 40 as been added   LDL 116, goal < 100 (<70 for diabetics)  Continue statin at discharge  Diabetes  HgbA1c 10.7, goal < 7.0  Uncontrolled  Ongoing aggressive diabetes education and control  Other Stroke Risk Factors  Morbid obesity, Body mass index is 53.32 kg/(m^2).  Obstructive sleep apnea, on CPAP at home  Hospital day # 1  Annie Main, MSN, RN, ANVP-BC, ANP-BC, [facility] Pager: [phone] 07/17/2014 11:25 AM   I have personally examined this patient, reviewed notes, independently viewed imaging studies, participated in medical decision making and plan of care. I have made any additions or clarifications directly to the above note. Agree with note above.    Delia Heady, MD Medical Director [facility] Pager: [phone] 07/17/2014 4:12 PM    To contact Stroke  Continuity provider, please refer to [email or web address]. After hours, contact General Neurology

## 2014-07-17 NOTE — Progress Notes (Signed)
  RD consulted for nutrition education regarding diabetes.   Lab Results  Component Value Date   HGBA1C 10.7* 07/16/2014    RD provided "Carbohydrate Counting for People with Diabetes" handout from the Academy of Nutrition and Dietetics. Discussed different food groups and their effects on blood sugar, emphasizing carbohydrate-containing foods. Provided list of carbohydrates and recommended serving sizes of common foods. Reviewed pt's dietary recall and provided tips for decreasing carbs and balancing meals. Pt reports drinking Coca[employer] and Pepsi all day every day PTA. She states she is able to eliminate soda and switch to drinking water.   Discussed importance of controlled and consistent carbohydrate intake throughout the day. Provided examples of ways to balance meals/snacks and encouraged intake of high-fiber, whole grain complex carbohydrates. Additionally provided some tips for lowering triglycerides and cholesterol. RD provided "1800 Calorie 5-day Sample Menus" from the Academy of Nutrition and Dietetics. Pt had several family members present for education.  Teach back method used.  Expect good compliance.  Body mass index is 53.32 kg/(m^2). Pt meets criteria for Morbid Obesity based on current BMI.  Current diet order is Carb Modified, patient is consuming approximately 50% of meals at this time. Pt is being discharged today. Labs and medications reviewed. No further nutrition interventions warranted at this time. RD contact information provided. If additional nutrition issues arise, please re-consult RD.  Ian Malkin RD, LDN Inpatient Clinical Dietitian Pager: [phone] After Hours Pager: [phone]

## 2014-07-17 NOTE — Clinical Documentation Improvement (Signed)
Presents with left sided weakness after having TIA.  Please further specify with a diagnosis what left sided weakness means if clinically significant. Please document findings in next progress note and discharge summary if applicable.  Thank You, Shellee Milo ,RN Clinical Documentation Specialist:  [phone]  [facility]- Health Information Management

## 2014-07-17 NOTE — Progress Notes (Signed)
Inpatient Diabetes Program Recommendations  AACE/ADA: New Consensus Statement on Inpatient Glycemic Control (2013)  Target Ranges:  Prepandial:   less than 140 mg/dL      Peak postprandial:   less than 180 mg/dL (1-2 hours)      Critically ill patients:  140 - 180 mg/dL   Inpatient Diabetes Program Recommendations HgbA1C: =10.7  This coordinator met with patient and family to discuss recent A1C.  Basic diabetes meal planning and twice daily glucose testing was discussed.  Encouraged pt to take meds as prescribed and follow-up with PCP concerning A1C.  No further questions/concerns at the end of our visit.   Thank you  Raoul Pitch BSN, RN,CDE Inpatient Diabetes Coordinator [phone] (team pager)

## 2014-07-23 ENCOUNTER — Telehealth: Payer: Self-pay | Admitting: Neurology

## 2014-07-23 NOTE — Telephone Encounter (Signed)
Called pt and no answer, wanted to make an appt for 2 mo hosp f/u with Dr. Roda ShuttersXu, because Dr. Pearlean BrownieSethi is out of the office at that time.

## 2014-07-30 ENCOUNTER — Telehealth: Payer: Self-pay | Admitting: Neurology

## 2014-07-30 NOTE — Telephone Encounter (Signed)
Called pt and left message for pt to call back for a stroke f/u appt with Dr. Roda ShuttersXu, because Dr. Pearlean BrownieSethi will not be in the office. Pt is a Dr. Pearlean BrownieSethi pt, but following up with Dr. Roda ShuttersXu, [place]

## 2014-08-04 ENCOUNTER — Encounter: Payer: Self-pay | Admitting: Neurology

## 2014-08-05 ENCOUNTER — Telehealth: Payer: Self-pay | Admitting: Neurology

## 2014-08-05 NOTE — Telephone Encounter (Signed)
I spoke to the patient about her work note request. I let her know that the letter was ready to be picked up at the [employer].

## 2014-08-05 NOTE — Telephone Encounter (Signed)
Late entry: Patient called requesting a medical note for work for Sunday, October 18th, 2015.

## 2014-08-05 NOTE — Telephone Encounter (Signed)
I attempted to contact the patient to let her know that I left her work note at the [employer].

## 2014-09-24 ENCOUNTER — Ambulatory Visit: Payer: Self-pay | Admitting: Neurology

## 2014-09-28 ENCOUNTER — Encounter: Payer: Self-pay | Admitting: Neurology

## 2014-09-29 ENCOUNTER — Encounter: Payer: Self-pay | Admitting: Neurology

## 2014-11-06 ENCOUNTER — Encounter: Payer: Self-pay | Admitting: Neurology

## 2015-03-05 ENCOUNTER — Ambulatory Visit: Payer: Self-pay | Admitting: Neurology

## 2015-03-10 ENCOUNTER — Encounter: Payer: Self-pay | Admitting: Neurology

## 2015-06-02 ENCOUNTER — Emergency Department (HOSPITAL_COMMUNITY): Payer: Federal, State, Local not specified - PPO

## 2015-06-02 ENCOUNTER — Inpatient Hospital Stay (HOSPITAL_COMMUNITY): Payer: Federal, State, Local not specified - PPO

## 2015-06-02 ENCOUNTER — Inpatient Hospital Stay (HOSPITAL_COMMUNITY)
Admission: EM | Admit: 2015-06-02 | Discharge: 2015-06-03 | DRG: 101 | Disposition: A | Discharge status: Home Health | Payer: Federal, State, Local not specified - PPO | Attending: Oncology | Admitting: Oncology

## 2015-06-02 ENCOUNTER — Encounter (HOSPITAL_COMMUNITY): Payer: Self-pay | Admitting: Emergency Medicine

## 2015-06-02 DIAGNOSIS — I10 Essential (primary) hypertension: Secondary | ICD-10-CM | POA: Diagnosis present

## 2015-06-02 DIAGNOSIS — E1121 Type 2 diabetes mellitus with diabetic nephropathy: Secondary | ICD-10-CM | POA: Diagnosis present

## 2015-06-02 DIAGNOSIS — S8000XA Contusion of unspecified knee, initial encounter: Secondary | ICD-10-CM | POA: Insufficient documentation

## 2015-06-02 DIAGNOSIS — G40409 Other generalized epilepsy and epileptic syndromes, not intractable, without status epilepticus: Secondary | ICD-10-CM | POA: Diagnosis present

## 2015-06-02 DIAGNOSIS — G4733 Obstructive sleep apnea (adult) (pediatric): Secondary | ICD-10-CM | POA: Diagnosis present

## 2015-06-02 DIAGNOSIS — R32 Unspecified urinary incontinence: Secondary | ICD-10-CM | POA: Diagnosis present

## 2015-06-02 DIAGNOSIS — S8002XA Contusion of left knee, initial encounter: Secondary | ICD-10-CM

## 2015-06-02 DIAGNOSIS — E1165 Type 2 diabetes mellitus with hyperglycemia: Secondary | ICD-10-CM | POA: Diagnosis present

## 2015-06-02 DIAGNOSIS — Z7902 Long term (current) use of antithrombotics/antiplatelets: Secondary | ICD-10-CM

## 2015-06-02 DIAGNOSIS — Z9989 Dependence on other enabling machines and devices: Secondary | ICD-10-CM

## 2015-06-02 DIAGNOSIS — W19XXXA Unspecified fall, initial encounter: Secondary | ICD-10-CM | POA: Diagnosis present

## 2015-06-02 DIAGNOSIS — G8929 Other chronic pain: Secondary | ICD-10-CM | POA: Diagnosis present

## 2015-06-02 DIAGNOSIS — M1711 Unilateral primary osteoarthritis, right knee: Secondary | ICD-10-CM | POA: Diagnosis present

## 2015-06-02 DIAGNOSIS — M25561 Pain in right knee: Secondary | ICD-10-CM

## 2015-06-02 DIAGNOSIS — R55 Syncope and collapse: Secondary | ICD-10-CM

## 2015-06-02 DIAGNOSIS — I5032 Chronic diastolic (congestive) heart failure: Secondary | ICD-10-CM | POA: Diagnosis present

## 2015-06-02 DIAGNOSIS — R569 Unspecified convulsions: Secondary | ICD-10-CM | POA: Diagnosis present

## 2015-06-02 DIAGNOSIS — S8001XA Contusion of right knee, initial encounter: Secondary | ICD-10-CM | POA: Diagnosis present

## 2015-06-02 DIAGNOSIS — E119 Type 2 diabetes mellitus without complications: Secondary | ICD-10-CM | POA: Diagnosis not present

## 2015-06-02 DIAGNOSIS — Z79899 Other long term (current) drug therapy: Secondary | ICD-10-CM

## 2015-06-02 DIAGNOSIS — Z8673 Personal history of transient ischemic attack (TIA), and cerebral infarction without residual deficits: Secondary | ICD-10-CM

## 2015-06-02 DIAGNOSIS — Z8249 Family history of ischemic heart disease and other diseases of the circulatory system: Secondary | ICD-10-CM

## 2015-06-02 DIAGNOSIS — G459 Transient cerebral ischemic attack, unspecified: Secondary | ICD-10-CM | POA: Diagnosis present

## 2015-06-02 DIAGNOSIS — M25562 Pain in left knee: Secondary | ICD-10-CM

## 2015-06-02 DIAGNOSIS — M25551 Pain in right hip: Secondary | ICD-10-CM

## 2015-06-02 HISTORY — DX: Unspecified convulsions: R56.9

## 2015-06-02 HISTORY — DX: Obstructive sleep apnea (adult) (pediatric): G47.33

## 2015-06-02 HISTORY — DX: Transient cerebral ischemic attack, unspecified: G45.9

## 2015-06-02 HISTORY — DX: Pure hypercholesterolemia, unspecified: E78.00

## 2015-06-02 HISTORY — DX: Type 2 diabetes mellitus without complications: E11.9

## 2015-06-02 HISTORY — DX: Dependence on other enabling machines and devices: Z99.89

## 2015-06-02 HISTORY — DX: Unspecified chronic bronchitis: J42

## 2015-06-02 HISTORY — DX: Unspecified osteoarthritis, unspecified site: M19.90

## 2015-06-02 HISTORY — DX: Heart failure, unspecified: I50.9

## 2015-06-02 LAB — CBC
HCT: 36.9 % (ref 36.0–46.0)
Hemoglobin: 12.5 g/dL (ref 12.0–15.0)
MCH: 30.1 pg (ref 26.0–34.0)
MCHC: 33.9 g/dL (ref 30.0–36.0)
MCV: 88.9 fL (ref 78.0–100.0)
Platelets: 203 10*3/uL (ref 150–400)
RBC: 4.15 MIL/uL (ref 3.87–5.11)
RDW: 14.1 % (ref 11.5–15.5)
WBC: 10.4 10*3/uL (ref 4.0–10.5)

## 2015-06-02 LAB — ETHANOL: Alcohol, Ethyl (B): <5 mg/dL (ref <5)

## 2015-06-02 LAB — URINE MICROSCOPIC-ADD ON

## 2015-06-02 LAB — URINALYSIS, ROUTINE W REFLEX MICROSCOPIC
Bilirubin Urine: NEGATIVE
Ketones, ur: 15 mg/dL — AB
Leukocytes, UA: NEGATIVE
Nitrite: NEGATIVE
PH: 5.5 (ref 5.0–8.0)
Protein, ur: NEGATIVE mg/dL
SPECIFIC GRAVITY, URINE: 1.025 (ref 1.005–1.030)
UROBILINOGEN UA: 0.2 mg/dL (ref 0.0–1.0)

## 2015-06-02 LAB — BASIC METABOLIC PANEL
Anion gap: 14 (ref 5–15)
BUN: 13 mg/dL (ref 6–20)
CHLORIDE: 99 mmol/L — AB (ref 101–111)
CO2: 27 mmol/L (ref 22–32)
Calcium: 8.8 mg/dL — ABNORMAL LOW (ref 8.9–10.3)
Creatinine, Ser: 0.85 mg/dL (ref 0.44–1.00)
GFR calc non Af Amer: >60 mL/min (ref >60)
GLUCOSE: 373 mg/dL — AB (ref 65–99)
POTASSIUM: 3.4 mmol/L — AB (ref 3.5–5.1)
Sodium: 140 mmol/L (ref 135–145)

## 2015-06-02 LAB — HEPATIC FUNCTION PANEL
ALK PHOS: 68 U/L (ref 38–126)
ALT: 22 U/L (ref 14–54)
AST: 38 U/L (ref 15–41)
Albumin: 3.4 g/dL — ABNORMAL LOW (ref 3.5–5.0)
Bilirubin, Direct: 0.3 mg/dL (ref 0.1–0.5)
Indirect Bilirubin: 0.9 mg/dL (ref 0.3–0.9)
TOTAL PROTEIN: 7 g/dL (ref 6.5–8.1)
Total Bilirubin: 1.2 mg/dL (ref 0.3–1.2)

## 2015-06-02 LAB — RAPID URINE DRUG SCREEN, HOSP PERFORMED
Amphetamines: NOT DETECTED
BENZODIAZEPINES: NOT DETECTED
Barbiturates: NOT DETECTED
Cocaine: NOT DETECTED
OPIATES: NOT DETECTED
TETRAHYDROCANNABINOL: NOT DETECTED

## 2015-06-02 LAB — I-STAT TROPONIN, ED: TROPONIN I, POC: 0.04 ng/mL (ref 0.00–0.08)

## 2015-06-02 LAB — TROPONIN I: Troponin I: 0.41 ng/mL — ABNORMAL HIGH (ref <0.031)

## 2015-06-02 LAB — CBG MONITORING, ED
Glucose-Capillary: 368 mg/dL — ABNORMAL HIGH (ref 65–99)
Glucose-Capillary: 280 mg/dL — ABNORMAL HIGH (ref 65–99)
Glucose-Capillary: 158 mg/dL — ABNORMAL HIGH (ref 65–99)

## 2015-06-02 LAB — MAGNESIUM: MAGNESIUM: 1.7 mg/dL (ref 1.7–2.4)

## 2015-06-02 LAB — TSH: TSH: 0.799 u[IU]/mL (ref 0.350–4.500)

## 2015-06-02 LAB — GLUCOSE, CAPILLARY: Glucose-Capillary: 206 mg/dL — ABNORMAL HIGH (ref 65–99)

## 2015-06-02 MED ORDER — LORAZEPAM 2 MG/ML IJ SOLN
INTRAMUSCULAR | Status: AC
  Filled 2015-06-02: qty 1

## 2015-06-02 MED ORDER — ATORVASTATIN CALCIUM 40 MG PO TABS
40.0000 mg | ORAL_TABLET | Freq: Every day | ORAL | Status: DC
  Administered 2015-06-02: 40 mg via ORAL
  Filled 2015-06-02: qty 1

## 2015-06-02 MED ORDER — ENOXAPARIN SODIUM 40 MG/0.4ML ~~LOC~~ SOLN
40.0000 mg | SUBCUTANEOUS | Status: DC
  Administered 2015-06-02: 40 mg via SUBCUTANEOUS
  Filled 2015-06-02: qty 0.4

## 2015-06-02 MED ORDER — GADOBENATE DIMEGLUMINE 529 MG/ML IV SOLN
20.0000 mL | Freq: Once | INTRAVENOUS | Status: AC | PRN
  Administered 2015-06-02: 20 mL via INTRAVENOUS

## 2015-06-02 MED ORDER — BENAZEPRIL HCL 40 MG PO TABS
40.0000 mg | ORAL_TABLET | Freq: Every day | ORAL | Status: DC
  Administered 2015-06-03: 40 mg via ORAL
  Filled 2015-06-02 (×2): qty 1

## 2015-06-02 MED ORDER — LORAZEPAM 2 MG/ML IJ SOLN: 1.0000 mg | INTRAMUSCULAR | Status: AC

## 2015-06-02 MED ORDER — CLOPIDOGREL BISULFATE 75 MG PO TABS
75.0000 mg | ORAL_TABLET | Freq: Every day | ORAL | Status: DC
  Administered 2015-06-03: 75 mg via ORAL
  Filled 2015-06-02: qty 1

## 2015-06-02 MED ORDER — INSULIN ASPART 100 UNIT/ML ~~LOC~~ SOLN
0.0000 [IU] | SUBCUTANEOUS | Status: DC
  Administered 2015-06-02: 5 [IU] via SUBCUTANEOUS
  Administered 2015-06-02: 3 [IU] via SUBCUTANEOUS
  Administered 2015-06-02: 5 [IU] via SUBCUTANEOUS
  Administered 2015-06-03 (×2): 3 [IU] via SUBCUTANEOUS
  Administered 2015-06-03: 11 [IU] via SUBCUTANEOUS
  Administered 2015-06-03: 3 [IU] via SUBCUTANEOUS
  Administered 2015-06-03: 5 [IU] via SUBCUTANEOUS
  Filled 2015-06-02: qty 1

## 2015-06-02 MED ORDER — LORAZEPAM 2 MG/ML IJ SOLN
1.0000 mg | Freq: Once | INTRAMUSCULAR | Status: DC
  Filled 2015-06-02: qty 1

## 2015-06-02 MED ORDER — PREGABALIN 50 MG PO CAPS
50.0000 mg | ORAL_CAPSULE | Freq: Every day | ORAL | Status: DC
  Administered 2015-06-02: 50 mg via ORAL
  Filled 2015-06-02: qty 1

## 2015-06-02 MED ORDER — POTASSIUM CHLORIDE IN NACL 40-0.9 MEQ/L-% IV SOLN
INTRAVENOUS | Status: AC
  Administered 2015-06-02: 75 mL/h via INTRAVENOUS
  Filled 2015-06-02: qty 1000

## 2015-06-02 MED ORDER — SODIUM CHLORIDE 0.9 % IV SOLN
1000.0000 mg | Freq: Once | INTRAVENOUS | Status: AC
  Administered 2015-06-02: 1000 mg via INTRAVENOUS
  Filled 2015-06-02: qty 10

## 2015-06-02 MED ORDER — TRAMADOL HCL 50 MG PO TABS
50.0000 mg | ORAL_TABLET | Freq: Four times a day (QID) | ORAL | Status: DC | PRN
  Administered 2015-06-02: 50 mg via ORAL
  Filled 2015-06-02: qty 1

## 2015-06-02 MED ORDER — CLONIDINE HCL 0.1 MG PO TABS
0.1000 mg | ORAL_TABLET | Freq: Two times a day (BID) | ORAL | Status: DC
  Administered 2015-06-02 – 2015-06-03 (×2): 0.1 mg via ORAL
  Filled 2015-06-02 (×2): qty 1

## 2015-06-02 MED ORDER — AMLODIPINE BESYLATE 10 MG PO TABS
10.0000 mg | ORAL_TABLET | Freq: Every day | ORAL | Status: DC
  Administered 2015-06-03: 10 mg via ORAL
  Filled 2015-06-02: qty 1

## 2015-06-02 MED ORDER — KETOROLAC TROMETHAMINE 15 MG/ML IJ SOLN
15.0000 mg | Freq: Once | INTRAMUSCULAR | Status: AC
  Administered 2015-06-02: 15 mg via INTRAVENOUS
  Filled 2015-06-02: qty 1

## 2015-06-02 MED ORDER — LORAZEPAM 2 MG/ML IJ SOLN
1.0000 mg | Freq: Once | INTRAMUSCULAR | Status: AC
Start: 2015-06-02 — End: 2015-06-02
  Administered 2015-06-02: 1 mg via INTRAVENOUS

## 2015-06-02 MED ORDER — CARVEDILOL 25 MG PO TABS
25.0000 mg | ORAL_TABLET | Freq: Two times a day (BID) | ORAL | Status: DC
  Administered 2015-06-03 (×2): 25 mg via ORAL
  Filled 2015-06-02 (×2): qty 1

## 2015-06-02 MED ORDER — SODIUM CHLORIDE 0.9 % IV SOLN
500.0000 mg | Freq: Two times a day (BID) | INTRAVENOUS | Status: DC
Start: 2015-06-02 — End: 2015-06-03
  Administered 2015-06-02 – 2015-06-03 (×2): 500 mg via INTRAVENOUS
  Filled 2015-06-02 (×3): qty 5

## 2015-06-02 NOTE — ED Notes (Signed)
Pt c/o leg pain bilat-- normally takes Ultram at home, Dr. Roseanne Reno notified for orders.

## 2015-06-02 NOTE — ED Notes (Signed)
Patient here post syncopal episode this AM. Works for [employer] and was leaving restroom this AM. Upon standing lost consciousness and woke up on the floor later. Unknown amount of time on the floor. Denies acute pain, but has chronic knee pain which she reports is worse right now.

## 2015-06-02 NOTE — Consult Note (Signed)
Admission H&P    Chief Complaint: New onset seizures.  HPI: Christy Potter is an 59 y.o. female with a history of diabetes mellitus, hypertension, hyperlipidemia and TIA's, brought to the emergency room following an episode of loss of consciousness at work. He should have a sudden onset of cramping of her right hand and foot follow by loss of consciousness. She woke up on the floor after an unknown time. Following arrival in the emergency room she had a witnessed generalized seizure. She was given Ativan IV and subsequently given a loading dose of Keppra IV. She has no previous history of seizure activity. She has been on Plavix for antiplatelets therapy. CT scan of her head showed no acute intracranial abnormality.  Past Medical History  Diagnosis Date  . Diabetes mellitus without complication   . Hypertension     History reviewed. No pertinent past surgical history.  Family History  Problem Relation Age of Onset  . Hypertension Father   . Hypertension Sister    Social History:  reports that she has never smoked. She does not have any smokeless tobacco history on file. She reports that she does not drink alcohol or use illicit drugs.  Allergies: No Known Allergies  Medications: Patient's preadmission medications were reviewed by me.  ROS: History obtained from patient's daughter.  General ROS: negative for - chills, fatigue, fever, night sweats, weight gain or weight loss Psychological ROS: negative for - behavioral disorder, hallucinations, memory difficulties, mood swings or suicidal ideation Ophthalmic ROS: negative for - blurry vision, double vision, eye pain or loss of vision ENT ROS: negative for - epistaxis, nasal discharge, oral lesions, sore throat, tinnitus or vertigo Allergy and Immunology ROS: negative for - hives or itchy/watery eyes Hematological and Lymphatic ROS: negative for - bleeding problems, bruising or swollen lymph nodes Endocrine ROS: negative for -  galactorrhea, hair pattern changes, polydipsia/polyuria or temperature intolerance Respiratory ROS: negative for - cough, hemoptysis, shortness of breath or wheezing Cardiovascular ROS: negative for - chest pain, dyspnea on exertion, edema or irregular heartbeat Gastrointestinal ROS: negative for - abdominal pain, diarrhea, hematemesis, nausea/vomiting or stool incontinence Genito-Urinary ROS: negative for - dysuria, hematuria, incontinence or urinary frequency/urgency Musculoskeletal ROS: Complaining of bilateral knee pain since falling earlier today Neurological ROS: as noted in HPI Dermatological ROS: negative for rash and skin lesion changes  Physical Examination: Blood pressure 176/86, pulse 100, temperature 98.2 F (36.8 C), temperature source Oral, resp. rate 16, SpO2 97 %.  HEENT-  Normocephalic, no lesions, without obvious abnormality.  Normal external eye and conjunctiva.  Normal TM's bilaterally.  Normal auditory canals and external ears. Normal external nose, mucus membranes and septum.  Normal pharynx. Neck supple with no masses, nodes, nodules or enlargement. Cardiovascular - regular rate and rhythm, S1, S2 normal, no murmur, click, rub or gallop Lungs - chest clear, no wheezing, rales, normal symmetric air entry Abdomen - soft, non-tender; bowel sounds normal; no masses,  no organomegaly Extremities - no joint deformities, effusion, or inflammation and no edema  Neurologic Examination: Patient was lethargic, confused and moderately agitated. She did not appear to be in any acute distress. Pupils were equal in size. Reactivity to light could not be determined as patient was uncooperative. Extraocular movements were full and conjugate on right and left lateral gaze. Visual fields could not be adequately tested. No facial weakness was noted. She moved extremities equally with normal symmetrical strength throughout. Muscle tone was normal throughout. Deep tendon reflexes were  trace to 1+ and  symmetrical. Plantar responses were mute bilaterally.  Results for orders placed or performed during the hospital encounter of 06/02/15 (from the past 48 hour(s))  I-stat troponin, ED     Status: None   Collection Time: 06/02/15  6:41 AM  Result Value Ref Range   Troponin i, poc 0.04 0.00 - 0.08 ng/mL   Comment 3            Comment: Due to the release kinetics of cTnI, a negative result within the first hours of the onset of symptoms does not rule out myocardial infarction with certainty. If myocardial infarction is still suspected, repeat the test at appropriate intervals.   Basic metabolic panel     Status: Abnormal   Collection Time: 06/02/15  6:53 AM  Result Value Ref Range   Sodium 140 135 - 145 mmol/L   Potassium 3.4 (L) 3.5 - 5.1 mmol/L    Comment: SPECIMEN HEMOLYZED. HEMOLYSIS MAY AFFECT INTEGRITY OF RESULTS.   Chloride 99 (L) 101 - 111 mmol/L   CO2 27 22 - 32 mmol/L   Glucose, Bld 373 (H) 65 - 99 mg/dL   BUN 13 6 - 20 mg/dL   Creatinine, Ser 0.85 0.44 - 1.00 mg/dL   Calcium 8.8 (L) 8.9 - 10.3 mg/dL   GFR calc non Af Amer >60 >60 mL/min   GFR calc Af Amer >60 >60 mL/min    Comment: (NOTE) The eGFR has been calculated using the CKD EPI equation. This calculation has not been validated in all clinical situations. eGFR's persistently <60 mL/min signify possible Chronic Kidney Disease.    Anion gap 14 5 - 15  CBC     Status: None   Collection Time: 06/02/15  6:53 AM  Result Value Ref Range   WBC 10.4 4.0 - 10.5 K/uL   RBC 4.15 3.87 - 5.11 MIL/uL   Hemoglobin 12.5 12.0 - 15.0 g/dL   HCT 36.9 36.0 - 46.0 %   MCV 88.9 78.0 - 100.0 fL   MCH 30.1 26.0 - 34.0 pg   MCHC 33.9 30.0 - 36.0 g/dL   RDW 14.1 11.5 - 15.5 %   Platelets 203 150 - 400 K/uL  CBG monitoring, ED     Status: Abnormal   Collection Time: 06/02/15  8:05 AM  Result Value Ref Range   Glucose-Capillary 368 (H) 65 - 99 mg/dL   Ct Head Wo Contrast  06/02/2015   CLINICAL DATA:  Acute  onset of syncope.  Initial encounter.  EXAM: CT HEAD WITHOUT CONTRAST  TECHNIQUE: Contiguous axial images were obtained from the base of the skull through the vertex without intravenous contrast.  COMPARISON:  CT of the head and MRI of the brain performed 07/16/2014  FINDINGS: There is no evidence of acute infarction, mass lesion, or intra- or extra-axial hemorrhage on CT.  Mild periventricular white matter change likely reflects small vessel ischemic microangiopathy.  The posterior fossa, including the cerebellum, brainstem and fourth ventricle, is within normal limits. The third and lateral ventricles, and basal ganglia are unremarkable in appearance. The cerebral hemispheres are symmetric in appearance, with normal gray-white differentiation. No mass effect or midline shift is seen.  There is no evidence of fracture; visualized osseous structures are unremarkable in appearance. The visualized portions of the orbits are within normal limits. There is mild partial opacification of the right side of the sphenoid sinus. The remaining paranasal sinuses and mastoid air cells are well-aerated. No significant soft tissue abnormalities are seen.  IMPRESSION: 1. No acute  intracranial pathology seen on CT. 2. Mild small vessel ischemic microangiopathy. 3. Mild partial opacification of the right side of the sphenoid sinus.   Electronically Signed   By: Garald Balding M.D.   On: 06/02/2015 06:24   Dg Knee Complete 4 Views Left  06/02/2015   CLINICAL DATA:  Status post fall, with left knee pain. Initial encounter.  EXAM: LEFT KNEE - COMPLETE 4+ VIEW  COMPARISON:  None.  FINDINGS: There is no evidence of fracture or dislocation. Mild medial compartment narrowing is noted. Marginal osteophytes are seen arising at all three compartments. Tibial spine osteophytes are also noted. A fabella is noted.  A small to moderate knee joint effusion is suspected. The visualized soft tissues are normal in appearance.  IMPRESSION: 1. No  evidence of fracture or dislocation. 2. Small to moderate knee joint effusion noted. 3. Mild tricompartmental osteoarthritis, with mild medial compartment narrowing.   Electronically Signed   By: Garald Balding M.D.   On: 06/02/2015 06:40   Dg Knee Complete 4 Views Right  06/02/2015   CLINICAL DATA:  Status post fall, with right knee pain. Initial encounter.  EXAM: RIGHT KNEE - COMPLETE 4+ VIEW  COMPARISON:  None.  FINDINGS: There is no evidence of fracture or dislocation. The joint spaces are preserved. Marginal osteophytes are seen arising at all three compartments, and at the tibial spine.  No significant joint effusion is seen. The visualized soft tissues are normal in appearance.  IMPRESSION: 1. No evidence of fracture or dislocation. 2. Mild tricompartmental osteoarthritis noted.   Electronically Signed   By: Garald Balding M.D.   On: 06/02/2015 06:41    Assessment/Plan 59 year old lady with diabetes mellitus, hypertension, hyperlipidemia and history of TIAs, presenting with new onset generalized seizures. Etiology is unclear. No focal deficits were noted on clinical exam. CT scan of the head was unremarkable.  Recommendations: 1. Continue Keppra 500 mg twice a day for seizure prevention. 2. MRI of the brain without and with contrast media. 3. EEG, routine adult study.  We will continue to follow this patient with you.  C.R. Nicole Kindred, MD [facility]  06/02/2015, 8:52 AM

## 2015-06-02 NOTE — Progress Notes (Signed)
EEG completed, results pending. 

## 2015-06-02 NOTE — ED Notes (Signed)
Pt sleeping soundly-- will arouse to verbal stimuli, equal grips, follows commands. Does fall back to sleep easily.

## 2015-06-02 NOTE — ED Provider Notes (Signed)
CSN: [id number]     Arrival date & time 06/02/15  0454 History   First MD Initiated Contact with Patient 06/02/15 0510     Chief Complaint  Patient presents with  . Loss of Consciousness     (Consider location/radiation/quality/duration/timing/severity/associated sxs/prior Treatment) Patient is a 59 y.o. female presenting with syncope. The history is provided by the patient.  Loss of Consciousness She states that she was going to the bathroom and, as she was getting up, her hand right hand and right foot cramped severely. She rated pain at 10/10. She then had a syncopal episode. There was no warning that she was going to pass out. She did not have any chest pain, heaviness, tightness, pressure. There were no palpitations and no dizziness or lightheadedness. She denies dyspnea, nausea, diaphoresis. Loss of consciousness was felt to be probably brief. She is complaining of pain in her knees but denies headache or any other pains. Coworkers stated that the speech was slurred when she came to, and it was about 2 minutes before speech returned to normal. She has had problems with slurred speech before but never in association with syncope. She's never had any problems with syncope.  Past Medical History  Diagnosis Date  . Diabetes mellitus without complication   . Hypertension    History reviewed. No pertinent past surgical history. Family History  Problem Relation Age of Onset  . Hypertension Father   . Hypertension Sister    Social History  Substance Use Topics  . Smoking status: Never Smoker   . Smokeless tobacco: None  . Alcohol Use: No   OB History    No data available     Review of Systems  Cardiovascular: Positive for syncope.  All other systems reviewed and are negative.     Allergies  Review of patient's allergies indicates no known allergies.  Home Medications   Prior to Admission medications   Medication Sig Start Date End Date Taking? Authorizing Provider   Alogliptin-Metformin HCl 12.02-999 MG TABS Take 1 tablet by mouth 2 (two) times daily with a meal.    Historical Provider, MD  amLODipine-benazepril (LOTREL) 10-40 MG per capsule Take 1 capsule by mouth daily.    Historical Provider, MD  atorvastatin (LIPITOR) 40 MG tablet Take 1 tablet (40 mg total) by mouth at bedtime. 07/17/14   Ripudeep Jenna Luo, MD  carvedilol (COREG) 25 MG tablet Take 25 mg by mouth 2 (two) times daily with a meal.    Historical Provider, MD  celecoxib (CELEBREX) 200 MG capsule Take 200 mg by mouth at bedtime.    Historical Provider, MD  cloNIDine (CATAPRES) 0.1 MG tablet Take 0.1 mg by mouth 2 (two) times daily.    Historical Provider, MD  furosemide (LASIX) 40 MG tablet Take 40 mg by mouth daily.    Historical Provider, MD  glimepiride (AMARYL) 4 MG tablet Take 1 tablet (4 mg total) by mouth daily with breakfast. 07/17/14   Ripudeep Jenna Luo, MD  potassium chloride SA (K-DUR,KLOR-CON) 20 MEQ tablet Take 20 mEq by mouth daily.    Historical Provider, MD  pregabalin (LYRICA) 50 MG capsule Take 50 mg by mouth at bedtime.    Historical Provider, MD  research study medication Take 100 mg by mouth daily. 07/17/14   Ripudeep Jenna Luo, MD  research study medication Take 90 mg by mouth 2 (two) times daily. 07/17/14   Ripudeep Jenna Luo, MD  telmisartan-hydrochlorothiazide (MICARDIS HCT) 80-25 MG per tablet Take 1 tablet by mouth  daily.    Historical Provider, MD  traMADol (ULTRAM) 50 MG tablet Take 50 mg by mouth daily as needed for moderate pain.    Historical Provider, MD   BP 185/78 mmHg  Pulse 102  Temp(Src) 98.2 F (36.8 C) (Oral)  Resp 17  SpO2 91% Physical Exam  Nursing note and vitals reviewed.  59 year old female, resting comfortably and in no acute distress. Vital signs are significant for hypertension and borderline tachycardia. Oxygen saturation is 97%, which is normal. Head is normocephalic and atraumatic. PERRLA, EOMI. Oropharynx is clear. Fundi are normal. Neck is nontender  and supple without adenopathy or JVD. There are no carotid bruits. Back is nontender and there is no CVA tenderness. Lungs are clear without rales, wheezes, or rhonchi. Chest is nontender. Heart has regular rate and rhythm without murmur. Abdomen is soft, flat, nontender without masses or hepatosplenomegaly and peristalsis is normoactive. Extremities have 2 + edema, full range of motion is present. There is mild tenderness to palpation over the anterior aspect of both knees. There is no swelling or deformity. Skin is warm and dry without rash. Neurologic: Mental status is normal, cranial nerves are intact, there are no motor or sensory deficits.  ED Course  Procedures (including critical care time) Labs Review Results for orders placed or performed during the hospital encounter of 06/02/15  Basic metabolic panel  Result Value Ref Range   Sodium 140 135 - 145 mmol/L   Potassium 3.4 (L) 3.5 - 5.1 mmol/L   Chloride 99 (L) 101 - 111 mmol/L   CO2 27 22 - 32 mmol/L   Glucose, Bld 373 (H) 65 - 99 mg/dL   BUN 13 6 - 20 mg/dL   Creatinine, Ser 7.82 0.44 - 1.00 mg/dL   Calcium 8.8 (L) 8.9 - 10.3 mg/dL   GFR calc non Af Amer >60 >60 mL/min   GFR calc Af Amer >60 >60 mL/min   Anion gap 14 5 - 15  CBC  Result Value Ref Range   WBC 10.4 4.0 - 10.5 K/uL   RBC 4.15 3.87 - 5.11 MIL/uL   Hemoglobin 12.5 12.0 - 15.0 g/dL   HCT 95.6 21.3 - 08.6 %   MCV 88.9 78.0 - 100.0 fL   MCH 30.1 26.0 - 34.0 pg   MCHC 33.9 30.0 - 36.0 g/dL   RDW 57.8 46.9 - 62.9 %   Platelets 203 150 - 400 K/uL  I-stat troponin, ED  Result Value Ref Range   Troponin i, poc 0.04 0.00 - 0.08 ng/mL   Comment 3           Imaging Review Ct Head Wo Contrast  06/02/2015   CLINICAL DATA:  Acute onset of syncope.  Initial encounter.  EXAM: CT HEAD WITHOUT CONTRAST  TECHNIQUE: Contiguous axial images were obtained from the base of the skull through the vertex without intravenous contrast.  COMPARISON:  CT of the head and MRI of  the brain performed 07/16/2014  FINDINGS: There is no evidence of acute infarction, mass lesion, or intra- or extra-axial hemorrhage on CT.  Mild periventricular white matter change likely reflects small vessel ischemic microangiopathy.  The posterior fossa, including the cerebellum, brainstem and fourth ventricle, is within normal limits. The third and lateral ventricles, and basal ganglia are unremarkable in appearance. The cerebral hemispheres are symmetric in appearance, with normal gray-white differentiation. No mass effect or midline shift is seen.  There is no evidence of fracture; visualized osseous structures are unremarkable in appearance.  The visualized portions of the orbits are within normal limits. There is mild partial opacification of the right side of the sphenoid sinus. The remaining paranasal sinuses and mastoid air cells are well-aerated. No significant soft tissue abnormalities are seen.  IMPRESSION: 1. No acute intracranial pathology seen on CT. 2. Mild small vessel ischemic microangiopathy. 3. Mild partial opacification of the right side of the sphenoid sinus.   Electronically Signed   By: Roanna Raider M.D.   On: 06/02/2015 06:24   Dg Knee Complete 4 Views Left  06/02/2015   CLINICAL DATA:  Status post fall, with left knee pain. Initial encounter.  EXAM: LEFT KNEE - COMPLETE 4+ VIEW  COMPARISON:  None.  FINDINGS: There is no evidence of fracture or dislocation. Mild medial compartment narrowing is noted. Marginal osteophytes are seen arising at all three compartments. Tibial spine osteophytes are also noted. A fabella is noted.  A small to moderate knee joint effusion is suspected. The visualized soft tissues are normal in appearance.  IMPRESSION: 1. No evidence of fracture or dislocation. 2. Small to moderate knee joint effusion noted. 3. Mild tricompartmental osteoarthritis, with mild medial compartment narrowing.   Electronically Signed   By: Roanna Raider M.D.   On: 06/02/2015 06:40    Dg Knee Complete 4 Views Right  06/02/2015   CLINICAL DATA:  Status post fall, with right knee pain. Initial encounter.  EXAM: RIGHT KNEE - COMPLETE 4+ VIEW  COMPARISON:  None.  FINDINGS: There is no evidence of fracture or dislocation. The joint spaces are preserved. Marginal osteophytes are seen arising at all three compartments, and at the tibial spine.  No significant joint effusion is seen. The visualized soft tissues are normal in appearance.  IMPRESSION: 1. No evidence of fracture or dislocation. 2. Mild tricompartmental osteoarthritis noted.   Electronically Signed   By: Roanna Raider M.D.   On: 06/02/2015 06:41     EKG Interpretation   Date/Time:  Wednesday June 02 2015 05:00:45 EDT Ventricular Rate:  99 PR Interval:  144 QRS Duration: 88 QT Interval:  389 QTC Calculation: 499 R Axis:   12 Text Interpretation:  Sinus rhythm Probable left atrial enlargement Left  ventricular hypertrophy Anterior Q waves, possibly due to LVH When  compared with ECG of 07/16/2014, No significant change was found Confirmed  by [facility]  MD, Jaquil Todt ([id number]) on 06/02/2015 5:11:06 AM      CRITICAL CARE Performed by: [email or web address],[email or web address] Total critical care time: 35 minutes Critical care time was exclusive of separately billable procedures and treating other patients. Critical care was necessary to treat or prevent imminent or life-threatening deterioration. Critical care was time spent personally by me on the following activities: development of treatment plan with patient and/or surrogate as well as nursing, discussions with consultants, evaluation of patient's response to treatment, examination of patient, obtaining history from patient or surrogate, ordering and performing treatments and interventions, ordering and review of laboratory studies, ordering and review of radiographic studies, pulse oximetry and re-evaluation of patient's condition.  MDM   Final diagnoses:  Syncope  Seizure  Contusion, knee,  left, initial encounter  Contusion, knee, right, initial encounter   Syncope which likely is in response to pain of her cramps. She is being sent for CT scan given herepisode of slurred speech. Electrolytes and troponin will be checked.  CT has come back unremarkable. The x-rays are also unremarkable. What waiting for laboratory work to come back, patient had a generalized seizure. At this point, I suspect  that her syncopal episode was also a seizure. She is noted to be quite hypertensive and is not coming out of the seizure quickly. She is given a dose of lorazepam and is also given a loading dose of levetiracetam. Neurology consult will be obtained. Case is discussed with Dr. Roseanne Reno who is coming to see the patient from the neurology service. Case is signed out to Dr. Radford Pax.  Dione Booze, MD 06/02/15 [phone]

## 2015-06-02 NOTE — ED Notes (Signed)
Daughter came to nurses station, stating "my mom is having a seizure or a stroke or something" - pt have full body-- tonic- clonic type seizure

## 2015-06-02 NOTE — ED Notes (Signed)
Pt cleaned. Chucks changed.

## 2015-06-02 NOTE — Procedures (Signed)
EEG report.  Brief clinical history:  59 year old lady with diabetes mellitus, hypertension, hyperlipidemia and history of TIAs, presenting with new onset generalized seizures. Etiology is unclear. No focal deficits were noted on clinical exam. CT scan of the head was unremarkable.  Technique: this is a 17 channel routine scalp EEG performed at the bedside with bipolar and monopolar montages arranged in accordance to the international 10/20 system of electrode placement. One channel was dedicated to EKG recording.  The study was performed during wakefulness, drowsiness, and stage 2 sleep. No activating procedures performed.   Description:In the wakeful state, the best background consisted of a medium amplitude, posterior dominant, well sustained, symmetric and reactive 8 Hz rhythm. Beta activity was noted over the anterior head regions. Drowsiness demonstrated dropout of the alpha rhythm. Stage 2 sleep showed symmetric and synchronous sleep spindles without intermixed epileptiform discharges. No focal or generalized epileptiform discharges noted.  No pathologic areas of slowing seen.  EKG showed sinus rhythm.   Impression: this is a normal awake and asleep EEG. Please, be aware that a normal EEG does not exclude the possibility of epilepsy.  Clinical correlation is advised.   Wyatt Portela, MD

## 2015-06-02 NOTE — H&P (Signed)
Date: 06/02/2015               Patient Name:  Christy Potter MRN: [id number]  DOB: September 23, 1956 Age / Sex: 59 y.o., female   PCP: Allyson Sabal, MD         Medical Service: Internal Medicine Teaching Service         Attending Physician: Dr. Nelva Nay, MD    First Contact: Dr. Eston Esters patel Pager: [phone]  Second Contact: Dr. Mikey Bussing Pager: [phone]       After Hours (After 5p/  First Contact Pager: [phone]  weekends / holidays): Second Contact Pager: [phone]   Chief Complaint: Loss of consciousness and seizures.  History of Present Illness: 70 Y O F with PMH of HTN, DM, TIA, OSA, presented today with complaints of Loss of consciousness and later seizures in th ED. Pt was in her normal state of health until early this morning, when she was found in the restroom, passed out on the floor, by her Co-worker. Pt works for the [employer]. Patient though drowsy at the time of my history but says she remembers when she passed out. She says she stood up and had this cramping in her right hand, and that's the last she rememebers. Per family and co-workers, she gets off at 3.30am, she went to the bathroom, and was found by her co-worker at 4.19am. Coworker noted her speech was slurred initially, when she came to. Patient denies having prior chest pain, SOB, abnormal heart rhythm or palpitations, headaches or weakness of her extremities. Per coworker and family, no trauma to tongue observed, foaming, fecal or urinary incontinence.  She was brought to the ED, and about 7.30 this am, she had a generalized tonic clonic seizure- lasting ~2-3 mins, aborted with  of ativan. Prior to seizure, daughter says patient was back to baseline, she started complaining of some right handed pain and cramping, patients hand assumed an unusual flexed position before the seizure became generalized. No trauma to tongue observed, foaming, fecal or urinary incontinence.   Patient has no prior hx of seizures, does not take  alcohol, use drugs, or smoke cigs, lives with her son, and per family has not had any complaints of headaches, neck stiffness , fever, weightloss, diarrhea, vomiting, cough, SOB or any other complaints listed above.  She has had right knee pain which is chronic.  In the Ed prior to seizure, head CT was done, which was unremarkable. Neurology was consulted, patient was loaded with 1g of Keppra, and IMTS was called to admit.    Meds: Current Facility-Administered Medications  Medication Dose Route Frequency Provider Last Rate Last Dose  . LORazepam (ATIVAN) 2 MG/ML injection            Current Outpatient Prescriptions  Medication Sig Dispense Refill  . Alogliptin-Metformin HCl 12.02-999 MG TABS Take 1 tablet by mouth 2 (two) times daily with a meal.    . amLODipine-benazepril (LOTREL) 10-40 MG per capsule Take 1 capsule by mouth daily.    Marland Kitchen atorvastatin (LIPITOR) 40 MG tablet Take 1 tablet (40 mg total) by mouth at bedtime. 30 tablet 4  . carvedilol (COREG) 25 MG tablet Take 25 mg by mouth 2 (two) times daily with a meal.    . celecoxib (CELEBREX) 200 MG capsule Take 200 mg by mouth 2 (two) times daily.     . cloNIDine (CATAPRES) 0.1 MG tablet Take 0.1 mg by mouth 2 (two) times daily.    . clopidogrel (PLAVIX) 75 MG  tablet Take 75 mg by mouth daily.    . Dulaglutide (TRULICITY) 1.5 MG/0.5ML SOPN Inject 0.5 mLs into the skin once a week.     . furosemide (LASIX) 40 MG tablet Take 40 mg by mouth daily.    Marland Kitchen glimepiride (AMARYL) 4 MG tablet Take 1 tablet (4 mg total) by mouth daily with breakfast. 30 tablet 4  . linagliptin (TRADJENTA) 5 MG TABS tablet Take 5 mg by mouth daily.    . potassium chloride SA (K-DUR,KLOR-CON) 20 MEQ tablet Take 20 mEq by mouth daily.    . pregabalin (LYRICA) 50 MG capsule Take 50 mg by mouth at bedtime.    Marland Kitchen telmisartan-hydrochlorothiazide (MICARDIS HCT) 80-25 MG per tablet Take 1 tablet by mouth daily.    . traMADol (ULTRAM) 50 MG tablet Take 50 mg by mouth daily as  needed for moderate pain.      Allergies: Allergies as of 06/02/2015  . (No Known Allergies)   Past Medical History  Diagnosis Date  . Diabetes mellitus without complication   . Hypertension    History reviewed. No pertinent past surgical history. Family History  Problem Relation Age of Onset  . Hypertension Father   . Hypertension Sister    Social History   Social History  . Marital Status: Single    Spouse Name: N/A  . Number of Children: N/A  . Years of Education: N/A   Occupational History  . Not on file.   Social History Main Topics  . Smoking status: Never Smoker   . Smokeless tobacco: Not on file  . Alcohol Use: No  . Drug Use: No  . Sexual Activity: Not on file   Other Topics Concern  . Not on file   Social History Narrative    Review of Systems: CONSTITUTIONAL- No Fever, weightloss. SKIN- No Rash, colour changes or itching. HEAD- No Headache or dizziness. RESPIRATORY- No Cough or SOB. CARDIAC- No Palpitations, or chest pain. GI- No nausea, vomiting, diarrhoea, abd pain. URINARY- No Frequency, urgency, straining or dysuria.  Physical Exam: Blood pressure 176/86, pulse 100, temperature 98.2 F (36.8 C), temperature source Oral, resp. rate 16, SpO2 97 %.  Exam limited by Patients mental status.   GENERAL- Very Drowsy after 1mg  Ativan in the Ed and post-ictal, had to be proded several times to answer questions or co-operate with exam,  appears as stated age, in no distress. HEENT- Atraumatic, normocephalic, PERRL, EOMI, oral mucosa appears moist, neck supple. CARDIAC- Tachycardic, 2/6 systolic murmur heard in aortic , pulmonary and tricuspid area. (Last echo-  Mild tricuspid regurg and trivial pulmonic regurg). RESP- Moving equal volumes of air, and clear to auscultation bilaterally, no wheezes or crackles. ABDOMEN- Soft, obese, nontender, no palpable masses or organomegaly, bowel sounds present. BACK- Normal curvature of the spine, No tenderness  along the vertebrae, has left sided CVA tenderness. NEURO- No obvious Cr N abnormality, strenght upper extremities- 5/5, Lower extremity- 4/5, bilaterally, patient copmplaining of pain in both lower extremities, Asterexis- ?negative, put patient not totally co-operative EXTREMITIES- pulse 2+, symmetric, legs puffy but no pitting pedal edema. SKIN- Warm, dry, No rash or lesion.  Lab results: Basic Metabolic Panel:  Recent Labs  16/10/96 0653  NA 140  K 3.4*  CL 99*  CO2 27  GLUCOSE 373*  BUN 13  CREATININE 0.85  CALCIUM 8.8*   Anion Gap- 14.   Liver Function Tests:  Recent Labs  06/02/15 0617  AST 38  ALT 22  ALKPHOS 68  BILITOT  1.2  PROT 7.0  ALBUMIN 3.4*   CBC:  Recent Labs  06/02/15 0653  WBC 10.4  HGB 12.5  HCT 36.9  MCV 88.9  PLT 203   CBG:  Recent Labs  06/02/15 0805  GLUCAP 368*   Urine Drug Screen: Drugs of Abuse     Component Value Date/Time   LABOPIA NONE DETECTED 07/16/2014 0410   COCAINSCRNUR NONE DETECTED 07/16/2014 0410   LABBENZ NONE DETECTED 07/16/2014 0410   AMPHETMU NONE DETECTED 07/16/2014 0410   THCU NONE DETECTED 07/16/2014 0410   LABBARB NONE DETECTED 07/16/2014 0410    Imaging results:  Ct Head Wo Contrast  06/02/2015   CLINICAL DATA:  Acute onset of syncope.  Initial encounter.  EXAM: CT HEAD WITHOUT CONTRAST  TECHNIQUE: Contiguous axial images were obtained from the base of the skull through the vertex without intravenous contrast.  COMPARISON:  CT of the head and MRI of the brain performed 07/16/2014  FINDINGS: There is no evidence of acute infarction, mass lesion, or intra- or extra-axial hemorrhage on CT.  Mild periventricular white matter change likely reflects small vessel ischemic microangiopathy.  The posterior fossa, including the cerebellum, brainstem and fourth ventricle, is within normal limits. The third and lateral ventricles, and basal ganglia are unremarkable in appearance. The cerebral hemispheres are symmetric  in appearance, with normal gray-white differentiation. No mass effect or midline shift is seen.  There is no evidence of fracture; visualized osseous structures are unremarkable in appearance. The visualized portions of the orbits are within normal limits. There is mild partial opacification of the right side of the sphenoid sinus. The remaining paranasal sinuses and mastoid air cells are well-aerated. No significant soft tissue abnormalities are seen.  IMPRESSION: 1. No acute intracranial pathology seen on CT. 2. Mild small vessel ischemic microangiopathy. 3. Mild partial opacification of the right side of the sphenoid sinus.   Electronically Signed   By: Roanna Raider M.D.   On: 06/02/2015 06:24   Dg Knee Complete 4 Views Left  06/02/2015   CLINICAL DATA:  Status post fall, with left knee pain. Initial encounter.  EXAM: LEFT KNEE - COMPLETE 4+ VIEW  COMPARISON:  None.  FINDINGS: There is no evidence of fracture or dislocation. Mild medial compartment narrowing is noted. Marginal osteophytes are seen arising at all three compartments. Tibial spine osteophytes are also noted. A fabella is noted.  A small to moderate knee joint effusion is suspected. The visualized soft tissues are normal in appearance.  IMPRESSION: 1. No evidence of fracture or dislocation. 2. Small to moderate knee joint effusion noted. 3. Mild tricompartmental osteoarthritis, with mild medial compartment narrowing.   Electronically Signed   By: Roanna Raider M.D.   On: 06/02/2015 06:40   Dg Knee Complete 4 Views Right  06/02/2015   CLINICAL DATA:  Status post fall, with right knee pain. Initial encounter.  EXAM: RIGHT KNEE - COMPLETE 4+ VIEW  COMPARISON:  None.  FINDINGS: There is no evidence of fracture or dislocation. The joint spaces are preserved. Marginal osteophytes are seen arising at all three compartments, and at the tibial spine.  No significant joint effusion is seen. The visualized soft tissues are normal in appearance.   IMPRESSION: 1. No evidence of fracture or dislocation. 2. Mild tricompartmental osteoarthritis noted.   Electronically Signed   By: Roanna Raider M.D.   On: 06/02/2015 06:41   Other results: EKG: Rate- 99Bpm, regular, sinus rhythm, PR interval- 144, no Pr segment abnormalities, QRS duration- 88,  Q waves in 1 and AVL- Old, ST elevation- V2- old, J point elevation- V3, QTc- 499. Compared to EKG- 24 sept 2015.  Assessment & Plan by Problem: Principal Problem:   Seizure Active Problems:   Diabetes mellitus without complication   Hypertension   TIA (transient ischemic attack)   OSA on CPAP  New onset Seizure and Loss of consciousness- Initial episode of LOC likely also preceded by seizure, considering prior hand pain/cramping. Appears patient had a partial seizure than became secondarily generalized. Etiology of seizure not identified as of yet. Considerations- Metabolic Vs Intracranial Pathology Vs infectious Vs Arrhythmia- considering sudden episode without presyncopal symptoms, prolonged Qtc of 499 increases patients risk. Also considering elevated Blood pressure, that appears chronically uncontrolled, consider PRES. Head Ct negative for mass lesions. Reviewed patients med, only tramadol on med list which lowers seizure threshold, and unsure if patient is taking this.  - Admit to tele- check for arrhythmia - Neuro consulted in the ED, recs appreciated - Loaded with Keppra 1000mg  in the Ed, Cont with IV Keppra 500mg  BID - MRI W Wo Contrast - EEG - IV Ativan 1mg  for seizures - Magnesium level - HIV - Trops X3 - UDS - Alcohol level - TSH - NPO for now, can start light diet later today if no more seizures and mental status improves. - IVF N/s +40 kcl 75cc/hr for 12 hrs, while NPO - Avoid opioids  Bilat knee pain- Acute on chronic( Chronic right knee pain) , Xrays without acute abnormallity. Likely patient fell on her knees.   Left CVA tenderness- No dysuric symptoms. Likely due to  fall. - Will check UA.  HTN- Appears uncontrolled- Bp in the Ed- 170s systolic. Home meds- Clonidine- 0.1mg  BID, HCTZ- 25mg  daily, Benazepril and Telmesatan, Amlodipine, Coreg- 25mg  daily. - Cont meds- Clonidine, benazepril, Amlodipine, - Hold Telmesatan- as pt is already on an ARB, also hold HCTZ for today, while hydrating, can resume in am.  Diabetes- appears uncontrolled. LAst HgbA1c- 10.7, 07/17/2015. Appears patient is resistant to starting insulin. Presently on 5 oral hypoglycemics. CBG on admission- 373. Home meds- Alogliptin, Metformin, Dulaglutide, Glimeperide, Linagliptin. - HgbA1c - Diabetes co-ordinator consult in HgbA1c elevated, patient might need to be on insulin, can be followed up as an outpt, but needs to be educated about diabetes and insulin therapy. - SS-M  Prior TIA- Pt is on  Plavix only. She was enrolled in the Socrates trial 2015 after her episode of TIA, which was already on aspirin then. - Cont Plavix.   Dispo: Disposition is deferred at this time, awaiting improvement of current medical problems.   The patient does have a current PCP Allyson Sabal, MD) and does need an [facility] hospital follow-up appointment after discharge.  The patient does not know have transportation limitations that hinder transportation to clinic appointments.  Signed: Onnie Boer, MD 06/02/2015, 9:42 AM

## 2015-06-02 NOTE — ED Notes (Signed)
CBG = 280   RN Clydie Braun informed of result.

## 2015-06-02 NOTE — ED Notes (Signed)
Dr Stewart at bedside.

## 2015-06-03 ENCOUNTER — Inpatient Hospital Stay (HOSPITAL_COMMUNITY): Payer: Federal, State, Local not specified - PPO

## 2015-06-03 ENCOUNTER — Encounter: Payer: Self-pay | Admitting: Internal Medicine

## 2015-06-03 DIAGNOSIS — S8001XA Contusion of right knee, initial encounter: Secondary | ICD-10-CM

## 2015-06-03 DIAGNOSIS — X58XXXA Exposure to other specified factors, initial encounter: Secondary | ICD-10-CM

## 2015-06-03 DIAGNOSIS — S8002XA Contusion of left knee, initial encounter: Secondary | ICD-10-CM

## 2015-06-03 DIAGNOSIS — S8000XA Contusion of unspecified knee, initial encounter: Secondary | ICD-10-CM | POA: Insufficient documentation

## 2015-06-03 LAB — GLUCOSE, CAPILLARY
GLUCOSE-CAPILLARY: 245 mg/dL — AB (ref 65–99)
Glucose-Capillary: 152 mg/dL — ABNORMAL HIGH (ref 65–99)
Glucose-Capillary: 162 mg/dL — ABNORMAL HIGH (ref 65–99)
Glucose-Capillary: 172 mg/dL — ABNORMAL HIGH (ref 65–99)
Glucose-Capillary: 323 mg/dL — ABNORMAL HIGH (ref 65–99)

## 2015-06-03 LAB — TROPONIN I
Troponin I: 0.37 ng/mL — ABNORMAL HIGH (ref <0.031)
Troponin I: 0.22 ng/mL — ABNORMAL HIGH (ref <0.031)

## 2015-06-03 LAB — HEMOGLOBIN A1C
Hgb A1c MFr Bld: 11.1 % — ABNORMAL HIGH (ref 4.8–5.6)
Mean Plasma Glucose: 272 mg/dL

## 2015-06-03 LAB — HIV ANTIBODY (ROUTINE TESTING W REFLEX): HIV Screen 4th Generation wRfx: NONREACTIVE

## 2015-06-03 MED ORDER — LEVETIRACETAM 500 MG PO TABS: 500.0000 mg | ORAL_TABLET | Freq: Two times a day (BID) | ORAL | Status: DC

## 2015-06-03 MED ORDER — INSULIN STARTER KIT- PEN NEEDLES (ENGLISH)
1.0000 | Freq: Once | Status: AC
  Administered 2015-06-03: 1
  Filled 2015-06-03: qty 1

## 2015-06-03 MED ORDER — KETOROLAC TROMETHAMINE 10 MG PO TABS
20.0000 mg | ORAL_TABLET | Freq: Once | ORAL | Status: AC
  Administered 2015-06-03: 20 mg via ORAL
  Filled 2015-06-03: qty 2

## 2015-06-03 MED ORDER — INSULIN GLARGINE 100 UNIT/ML SOLOSTAR PEN: 10.0000 [IU] | PEN_INJECTOR | Freq: Every day | SUBCUTANEOUS | Status: AC

## 2015-06-03 MED ORDER — LEVETIRACETAM 500 MG PO TABS
500.0000 mg | ORAL_TABLET | Freq: Two times a day (BID) | ORAL | Status: DC
  Filled 2015-06-03: qty 1

## 2015-06-03 MED ORDER — HYDROCHLOROTHIAZIDE 25 MG PO TABS: 25.0000 mg | ORAL_TABLET | Freq: Every day | ORAL | Status: AC

## 2015-06-03 NOTE — Progress Notes (Signed)
Rounding Note:  Saw patient in regards to my colleagues note, to ask her about possibility of starting basal insulin once a day to control her glucose levels. Patient is agreeable to once daily basal insulin injections if the physician wishes to pursue this option. Notified patient about her A1c in September 2015 10.7% and her A1c levels now being 11.1%. Spoke with her about her medication regimen and told her the MD will address her medications with her and will be listed on her d/c paperwork. I asked patient what number she was seeing on her glucose meter she said mostly 200's but will go up to 300 4-5 times a week. This is consistent with the A1c level. Patient's daughter was just recently diagnosed as having pre diabetes. Spoke with daughter and patient about diet and physical activity and gave them each diabetes meal planning guides. Patient and daughter had no further questions at this time.   Thanks,  Christena Deem RN, MSN, [facility] Inpatient Diabetes Coordinator Team Pager [phone]

## 2015-06-03 NOTE — Progress Notes (Addendum)
Inpatient Diabetes Program Recommendations  AACE/ADA: New Consensus Statement on Inpatient Glycemic Control (2013)  Target Ranges:  Prepandial:   less than 140 mg/dL      Peak postprandial:   less than 180 mg/dL (1-2 hours)      Critically ill patients:  140 - 180 mg/dL    Results for PHEBE, DETTMER (MRN [id number]) as of 06/03/2015 11:38  Ref. Range 07/16/2014 15:50 06/02/2015 19:10  Hemoglobin A1C Latest Ref Range: 4.8-5.6 % 10.7 (H) 11.1 (H)    Results for SARYA, LINENBERGER (MRN [id number]) as of 06/03/2015 11:38  Ref. Range 06/02/2015 08:05 06/02/2015 12:28 06/02/2015 18:14 06/02/2015 20:04  Glucose-Capillary Latest Ref Range: 65-99 mg/dL 782 (H) 956 (H) 213 (H) 206 (H)     Admit with: Seizures  History: DM  Home DM Meds: Trulicity 1.5 mg QWeek       Amaryl 4 mg daily       Aloglipitin/Metformin- 12.02/999 mg- 1 tablet bid       Tradjenta 5 mg daily  Current DM Orders: Novolog Moderate SSI (0-15 units) Q4 hours     -Per MD notes, note that patient very resistant to the idea of starting insulin at home.  Is currently on 5 different medications at home (of which, alogliptin and Tradjenta are the same exact medication).  -Confused as to why patient is resistant to taking insulin if she is already injecting herself with Trulicity once weekly.  -Patient's A1c is relatively unchanged since September of last year.  -Will order insulin teaching by the RN and will have DM Coordinator speak with patient asap.    MD- Perhaps patient would do well with once daily basal insulin (Lantus or Levemir) along with her weekly Trulicity and bid dosing of Metformin?    Will follow Ambrose Finland RN, MSN, CDE Diabetes Coordinator Inpatient Glycemic Control Team Team Pager: [phone] (8a-5p)

## 2015-06-03 NOTE — Progress Notes (Signed)
Subjective: Patient doing well overnight, no complaints of headaches, chest pain, difficulty breathing, seizures, or cramping. She only notes some R knee pain where she has osteoarthritis.  Objective: Vital signs in last 24 hours: Filed Vitals:   06/02/15 1848 06/02/15 2001 06/03/15 0454 06/03/15 1016  BP: 185/89 171/82 154/86 177/85  Pulse: 83 86 82   Temp: 98.6 F (37 C) 98.6 F (37 C) 98.4 F (36.9 C)   TempSrc: Oral Oral Oral   Resp:  20 18   Weight:   128.776 kg (283 lb 14.4 oz)   SpO2: 97% 95% 92%    Weight change:   Intake/Output Summary (Last 24 hours) at 06/03/15 1026 Last data filed at 06/03/15 0700  Gross per 24 hour  Intake    240 ml  Output      0 ml  Net    240 ml   BP 177/85 mmHg  Pulse 82  Temp(Src) 98.4 F (36.9 C) (Oral)  Resp 18  Wt 128.776 kg (283 lb 14.4 oz)  SpO2 92%  General Appearance:    Alert, cooperative, no distress, appears stated age  Head:    Normocephalic, without obvious abnormality, atraumatic  Neck:   Supple, symmetrical, trachea midline, no adenopathy;    no carotid bruit or JVD  Lungs:     Clear to auscultation bilaterally, respirations unlabored  Chest Wall:    No tenderness or deformity   Heart:    Regular rate and rhythm, S1 and S2 normal, systolic murmur heard in pulmonic area, no rub  or gallop  Abdomen:     Soft, non-tender  Extremities:   Extremities normal, atraumatic, no cyanosis or edema  Pulses:   2+ and symmetric all extremities  Skin:   Skin color, texture, turgor normal, no rashes or lesions   Lab Results: Results for orders placed or performed during the hospital encounter of 06/02/15 (from the past 24 hour(s))  CBG monitoring, ED     Status: Abnormal   Collection Time: 06/02/15 12:28 PM  Result Value Ref Range   Glucose-Capillary 280 (H) 65 - 99 mg/dL  CBG monitoring, ED     Status: Abnormal   Collection Time: 06/02/15  6:14 PM  Result Value Ref Range   Glucose-Capillary 158 (H) 65 - 99 mg/dL  Hemoglobin  [id number]     Status: Abnormal   Collection Time: 06/02/15  7:10 PM  Result Value Ref Range   Hgb A1c MFr Bld 11.1 (H) 4.8 - 5.6 %   Mean Plasma Glucose 272 mg/dL  TSH     Status: None   Collection Time: 06/02/15  7:10 PM  Result Value Ref Range   TSH 0.799 0.350 - 4.500 uIU/mL  Troponin I     Status: Abnormal   Collection Time: 06/02/15  7:10 PM  Result Value Ref Range   Troponin I 0.41 (H) <0.031 ng/mL  Ethanol     Status: None   Collection Time: 06/02/15  7:10 PM  Result Value Ref Range   Alcohol, Ethyl (B) <5 <5 mg/dL  HIV antibody     Status: None   Collection Time: 06/02/15  7:10 PM  Result Value Ref Range   HIV Screen 4th Generation wRfx Non Reactive Non Reactive  Glucose, capillary     Status: Abnormal   Collection Time: 06/02/15  8:04 PM  Result Value Ref Range   Glucose-Capillary 206 (H) 65 - 99 mg/dL  Glucose, capillary     Status: Abnormal   Collection Time:  06/03/15 12:04 AM  Result Value Ref Range   Glucose-Capillary 245 (H) 65 - 99 mg/dL  Troponin I     Status: Abnormal   Collection Time: 06/03/15  1:03 AM  Result Value Ref Range   Troponin I 0.37 (H) <0.031 ng/mL  Glucose, capillary     Status: Abnormal   Collection Time: 06/03/15  4:51 AM  Result Value Ref Range   Glucose-Capillary 152 (H) 65 - 99 mg/dL  Glucose, capillary     Status: Abnormal   Collection Time: 06/03/15  7:27 AM  Result Value Ref Range   Glucose-Capillary 162 (H) 65 - 99 mg/dL  Troponin I     Status: Abnormal   Collection Time: 06/03/15  7:49 AM  Result Value Ref Range   Troponin I 0.22 (H) <0.031 ng/mL   Micro Results: No results found for this or any previous visit (from the past 240 hour(s)). Studies/Results: Ct Head Wo Contrast  06/02/2015   CLINICAL DATA:  Acute onset of syncope.  Initial encounter.  EXAM: CT HEAD WITHOUT CONTRAST  TECHNIQUE: Contiguous axial images were obtained from the base of the skull through the vertex without intravenous contrast.  COMPARISON:  CT of the head  and MRI of the brain performed 07/16/2014  FINDINGS: There is no evidence of acute infarction, mass lesion, or intra- or extra-axial hemorrhage on CT.  Mild periventricular white matter change likely reflects small vessel ischemic microangiopathy.  The posterior fossa, including the cerebellum, brainstem and fourth ventricle, is within normal limits. The third and lateral ventricles, and basal ganglia are unremarkable in appearance. The cerebral hemispheres are symmetric in appearance, with normal gray-white differentiation. No mass effect or midline shift is seen.  There is no evidence of fracture; visualized osseous structures are unremarkable in appearance. The visualized portions of the orbits are within normal limits. There is mild partial opacification of the right side of the sphenoid sinus. The remaining paranasal sinuses and mastoid air cells are well-aerated. No significant soft tissue abnormalities are seen.  IMPRESSION: 1. No acute intracranial pathology seen on CT. 2. Mild small vessel ischemic microangiopathy. 3. Mild partial opacification of the right side of the sphenoid sinus.   Electronically Signed   By: Roanna Raider M.D.   On: 06/02/2015 06:24   Mr Laqueta Jean ON Contrast  06/02/2015   CLINICAL DATA:  New onset seizure. Syncopal episode at work. Witnessed generalized seizure in the Emergency Department.  EXAM: MRI HEAD WITHOUT AND WITH CONTRAST  TECHNIQUE: Multiplanar, multiecho pulse sequences of the brain and surrounding structures were obtained without and with intravenous contrast.  CONTRAST:  20mL MULTIHANCE GADOBENATE DIMEGLUMINE 529 MG/ML IV SOLN  COMPARISON:  CT head from the same day.  MRI brain 07/16/2014.  FINDINGS: The study is mildly degraded by patient motion. No acute infarct is present. Moderate periventricular and subcortical T2 changes bilaterally are stable. The ventricles are of normal size. No significant extra-axial fluid collection is present.  Multiple foci of  susceptibility are again scattered throughout the brain, predominantly gray-white junction. Is are somewhat obscured by patient motion. No definite new lesions are present. There is no significant expansion of the lesions.  Dedicated imaging of the temporal lobes demonstrates symmetric size and signal of the hippocampal structures bilaterally.  Flow is present in the major intracranial arteries. The globes and orbits are intact. The paranasal sinuses are clear. There is some fluid in the right mastoid air cells. No obstructing nasopharyngeal lesion is evident.  IMPRESSION: A  1.  No acute intracranial abnormality or significant interval change. No acute or focal lesion to explain seizures. 2. Mild age advanced atrophy and white matter disease. 3. Scattered foci of susceptibility along the gray-white junction are not significantly changed from prior study. These are likely related to a chronic vasculopathy more hypertension.   Electronically Signed   By: Marin Roberts M.D.   On: 06/02/2015 16:56   Dg Knee Complete 4 Views Left  06/02/2015   CLINICAL DATA:  Status post fall, with left knee pain. Initial encounter.  EXAM: LEFT KNEE - COMPLETE 4+ VIEW  COMPARISON:  None.  FINDINGS: There is no evidence of fracture or dislocation. Mild medial compartment narrowing is noted. Marginal osteophytes are seen arising at all three compartments. Tibial spine osteophytes are also noted. A fabella is noted.  A small to moderate knee joint effusion is suspected. The visualized soft tissues are normal in appearance.  IMPRESSION: 1. No evidence of fracture or dislocation. 2. Small to moderate knee joint effusion noted. 3. Mild tricompartmental osteoarthritis, with mild medial compartment narrowing.   Electronically Signed   By: Roanna Raider M.D.   On: 06/02/2015 06:40   Dg Knee Complete 4 Views Right  06/02/2015   CLINICAL DATA:  Status post fall, with right knee pain. Initial encounter.  EXAM: RIGHT KNEE - COMPLETE 4+  VIEW  COMPARISON:  None.  FINDINGS: There is no evidence of fracture or dislocation. The joint spaces are preserved. Marginal osteophytes are seen arising at all three compartments, and at the tibial spine.  No significant joint effusion is seen. The visualized soft tissues are normal in appearance.  IMPRESSION: 1. No evidence of fracture or dislocation. 2. Mild tricompartmental osteoarthritis noted.   Electronically Signed   By: Roanna Raider M.D.   On: 06/02/2015 06:41   Medications: I have reviewed the patient's current medications. Scheduled Meds: . amLODipine  10 mg Oral Daily  . atorvastatin  40 mg Oral QHS  . benazepril  40 mg Oral Daily  . carvedilol  25 mg Oral BID WC  . cloNIDine  0.1 mg Oral BID  . clopidogrel  75 mg Oral Daily  . enoxaparin (LOVENOX) injection  40 mg Subcutaneous Q24H  . insulin aspart  0-15 Units Subcutaneous 6 times per day  . levETIRAcetam  500 mg Intravenous Q12H  . LORazepam  1 mg Intravenous Once  . pregabalin  50 mg Oral QHS   Continuous Infusions:  PRN Meds:. Assessment/Plan: Principal Problem:   Seizure Active Problems:   Diabetes mellitus without complication   Hypertension   TIA (transient ischemic attack)   OSA on CPAP  New onset Seizure and Loss of consciousness- Initial episode of LOC likely also preceded by seizure, considering prior hand pain/cramping. Appears patient had a partial seizure than became secondarily generalized. Etiology of seizure not identified as of yet. Considerations- Metabolic Vs Intracranial Pathology Vs infectious Vs Arrhythmia- considering sudden episode without presyncopal symptoms, prolonged Qtc of 499 increases patients risk. Also considering elevated Blood pressure, that appears chronically uncontrolled, consider PRES. Head Ct negative for mass lesions. Reviewed patients med, only tramadol on med list which lowers seizure threshold, and unsure if patient is taking this.  - Monitor on telemetry - MRI W Wo Contrast  unremarkable - EEG showed no abnormalities - Trop initially 0.04, spiked to 0.41 evening of 8/10, trending down currently 0.22 - IV Ativan  PRN for seizures - Magnesium level 1.7 - HIV negative - UDS negative - Alcohol level negative -  TSH 0.799 - Clear liquid diet - Avoid opioids - Cont with IV Keppra 500mg  BID - Neurology following, appreciate recs  Right knee pain- Acute on chronic - Xrays without acute abnormallity - PT consulted about further workup - D/C tramadol for pain - Toradol 20mg  once, 10 mg q4h PRN afterwards  HTN- Controlled since admission, Bp in the Ed- 170s systolic, currently 154 systolic.  - Continue Clonidine, benazepril, Amlodipine, - Hold Telmesatan- as pt is already on an ACE-i - Restart hydralazine - Med reconciliation  Diabetes- uncontrolled, A1C 11.1% this admission. Appears patient is resistant to starting insulin. Presently on 5 oral hypoglycemics. Home meds- Alogliptin, Metformin, Dulaglutide, Glimeperide, Linagliptin. - Diabetes co-ordinator consult  - SS-M - Consolidate diabetes medicines  Prior TIA- Pt is onPlavix only. She was enrolled in the Socrates trial 2015 after her episode of TIA, which was already on aspirin then. - Cont Plavix.   Dispo: Disposition is deferred at this time, awaiting improvement of current medical problems.   This is a Psychologist, occupational Note.  The care of the patient was discussed with Dr. Darreld Mclean and the assessment and plan formulated with their assistance.  Please see their attached note for official documentation of the daily encounter.   LOS: 1 day   Eyvonne Mechanic, Med Student 06/03/2015, 10:26 AM

## 2015-06-03 NOTE — Progress Notes (Signed)
Subjective: Patient feels improved, no continued complaints of cramping, headaches, CP, SOB, or seizure-like activity. She does note continued R. Knee pain and right hip pain. No other changes overnight. Objective: Vital signs in last 24 hours: Filed Vitals:   06/02/15 1500 06/02/15 1848 06/02/15 2001 06/03/15 0454  BP: 159/82 185/89 171/82 154/86  Pulse: 90 83 86 82  Temp:  98.6 F (37 C) 98.6 F (37 C) 98.4 F (36.9 C)  TempSrc:  Oral Oral Oral  Resp: 29  20 18   Weight:    283 lb 14.4 oz (128.776 kg)  SpO2: 94% 97% 95% 92%   Weight change:   Intake/Output Summary (Last 24 hours) at 06/03/15 1019 Last data filed at 06/03/15 0700  Gross per 24 hour  Intake    240 ml  Output      0 ml  Net    240 ml   General: resting in bed HEENT: EOMI, no scleral icterus Cardiac: regular rate, systolic murmur heard at upper sternal borders Pulm: clear to auscultation bilaterally, moving normal volumes of air Abd: soft, nontender, nondistended Ext: warm and well perfused, no pedal edema Neuro: alert and oriented X3, motor strength decreased on RLE extension compared to left (limited due to pain), UE strength intact, no dysdiadochokinesis or focal deficit  Lab Results: Basic Metabolic Panel:  Recent Labs Lab 06/02/15 0645 06/02/15 0653  NA  --  140  K  --  3.4*  CL  --  99*  CO2  --  27  GLUCOSE  --  373*  BUN  --  13  CREATININE  --  0.85  CALCIUM  --  8.8*  MG 1.7  --    Liver Function Tests:  Recent Labs Lab 06/02/15 0617  AST 38  ALT 22  ALKPHOS 68  BILITOT 1.2  PROT 7.0  ALBUMIN 3.4*   CBC:  Recent Labs Lab 06/02/15 0653  WBC 10.4  HGB 12.5  HCT 36.9  MCV 88.9  PLT 203   Cardiac Enzymes:  Recent Labs Lab 06/02/15 1910 06/03/15 0103 06/03/15 0749  TROPONINI 0.41* 0.37* 0.22*   CBG:  Recent Labs Lab 06/02/15 1228 06/02/15 1814 06/02/15 2004 06/03/15 0004 06/03/15 0451 06/03/15 0727  GLUCAP 280* 158* 206* 245* 152* 162*   Hemoglobin  A1C:  Recent Labs Lab 06/02/15 1910  HGBA1C 11.1*   Thyroid Function Tests:  Recent Labs Lab 06/02/15 1910  TSH 0.799   Urine Drug Screen: Drugs of Abuse     Component Value Date/Time   LABOPIA NONE DETECTED 06/02/2015 0945   COCAINSCRNUR NONE DETECTED 06/02/2015 0945   LABBENZ NONE DETECTED 06/02/2015 0945   AMPHETMU NONE DETECTED 06/02/2015 0945   THCU NONE DETECTED 06/02/2015 0945   LABBARB NONE DETECTED 06/02/2015 0945    Alcohol Level:  Recent Labs Lab 06/02/15 1910  ETH <5   Urinalysis:  Recent Labs Lab 06/02/15 0945  COLORURINE YELLOW  LABSPEC 1.025  PHURINE 5.5  GLUCOSEU >1000*  HGBUR SMALL*  BILIRUBINUR NEGATIVE  KETONESUR 15*  PROTEINUR NEGATIVE  UROBILINOGEN 0.2  NITRITE NEGATIVE  LEUKOCYTESUR NEGATIVE   Studies/Results: Ct Head Wo Contrast  06/02/2015   CLINICAL DATA:  Acute onset of syncope.  Initial encounter.  EXAM: CT HEAD WITHOUT CONTRAST  TECHNIQUE: Contiguous axial images were obtained from the base of the skull through the vertex without intravenous contrast.  COMPARISON:  CT of the head and MRI of the brain performed 07/16/2014  FINDINGS: There is no evidence of acute infarction,  mass lesion, or intra- or extra-axial hemorrhage on CT.  Mild periventricular white matter change likely reflects small vessel ischemic microangiopathy.  The posterior fossa, including the cerebellum, brainstem and fourth ventricle, is within normal limits. The third and lateral ventricles, and basal ganglia are unremarkable in appearance. The cerebral hemispheres are symmetric in appearance, with normal gray-white differentiation. No mass effect or midline shift is seen.  There is no evidence of fracture; visualized osseous structures are unremarkable in appearance. The visualized portions of the orbits are within normal limits. There is mild partial opacification of the right side of the sphenoid sinus. The remaining paranasal sinuses and mastoid air cells are  well-aerated. No significant soft tissue abnormalities are seen.  IMPRESSION: 1. No acute intracranial pathology seen on CT. 2. Mild small vessel ischemic microangiopathy. 3. Mild partial opacification of the right side of the sphenoid sinus.   Electronically Signed   By: Roanna Raider M.D.   On: 06/02/2015 06:24   Mr Laqueta Jean ZO Contrast  06/02/2015   CLINICAL DATA:  New onset seizure. Syncopal episode at work. Witnessed generalized seizure in the Emergency Department.  EXAM: MRI HEAD WITHOUT AND WITH CONTRAST  TECHNIQUE: Multiplanar, multiecho pulse sequences of the brain and surrounding structures were obtained without and with intravenous contrast.  CONTRAST:  20mL MULTIHANCE GADOBENATE DIMEGLUMINE 529 MG/ML IV SOLN  COMPARISON:  CT head from the same day.  MRI brain 07/16/2014.  FINDINGS: The study is mildly degraded by patient motion. No acute infarct is present. Moderate periventricular and subcortical T2 changes bilaterally are stable. The ventricles are of normal size. No significant extra-axial fluid collection is present.  Multiple foci of susceptibility are again scattered throughout the brain, predominantly gray-white junction. Is are somewhat obscured by patient motion. No definite new lesions are present. There is no significant expansion of the lesions.  Dedicated imaging of the temporal lobes demonstrates symmetric size and signal of the hippocampal structures bilaterally.  Flow is present in the major intracranial arteries. The globes and orbits are intact. The paranasal sinuses are clear. There is some fluid in the right mastoid air cells. No obstructing nasopharyngeal lesion is evident.  IMPRESSION: A  1. No acute intracranial abnormality or significant interval change. No acute or focal lesion to explain seizures. 2. Mild age advanced atrophy and white matter disease. 3. Scattered foci of susceptibility along the gray-white junction are not significantly changed from prior study. These are  likely related to a chronic vasculopathy more hypertension.   Electronically Signed   By: Marin Roberts M.D.   On: 06/02/2015 16:56   Dg Knee Complete 4 Views Left  06/02/2015   CLINICAL DATA:  Status post fall, with left knee pain. Initial encounter.  EXAM: LEFT KNEE - COMPLETE 4+ VIEW  COMPARISON:  None.  FINDINGS: There is no evidence of fracture or dislocation. Mild medial compartment narrowing is noted. Marginal osteophytes are seen arising at all three compartments. Tibial spine osteophytes are also noted. A fabella is noted.  A small to moderate knee joint effusion is suspected. The visualized soft tissues are normal in appearance.  IMPRESSION: 1. No evidence of fracture or dislocation. 2. Small to moderate knee joint effusion noted. 3. Mild tricompartmental osteoarthritis, with mild medial compartment narrowing.   Electronically Signed   By: Roanna Raider M.D.   On: 06/02/2015 06:40   Dg Knee Complete 4 Views Right  06/02/2015   CLINICAL DATA:  Status post fall, with right knee pain. Initial encounter.  EXAM:  RIGHT KNEE - COMPLETE 4+ VIEW  COMPARISON:  None.  FINDINGS: There is no evidence of fracture or dislocation. The joint spaces are preserved. Marginal osteophytes are seen arising at all three compartments, and at the tibial spine.  No significant joint effusion is seen. The visualized soft tissues are normal in appearance.  IMPRESSION: 1. No evidence of fracture or dislocation. 2. Mild tricompartmental osteoarthritis noted.   Electronically Signed   By: Roanna Raider M.D.   On: 06/02/2015 06:41   Medications: I have reviewed the patient's current medications. Scheduled Meds: . amLODipine  10 mg Oral Daily  . atorvastatin  40 mg Oral QHS  . benazepril  40 mg Oral Daily  . carvedilol  25 mg Oral BID WC  . cloNIDine  0.1 mg Oral BID  . clopidogrel  75 mg Oral Daily  . enoxaparin (LOVENOX) injection  40 mg Subcutaneous Q24H  . insulin aspart  0-15 Units Subcutaneous 6 times per  day  . levETIRAcetam  500 mg Intravenous Q12H  . LORazepam  1 mg Intravenous Once  . pregabalin  50 mg Oral QHS   PRN Meds:. Assessment/Plan: Principal Problem:   Seizure Active Problems:   Diabetes mellitus without complication   Hypertension   TIA (transient ischemic attack)   OSA on CPAP  Seizure: Patient had new onset tonic-clonic seizure and loss of consciousness witnessed in the ED yesterday with incontinence. She had an initial episode prior to ED arrival in which she was using the bathroom, began to stand up and had cramping sensations in her right hand and then leg. She states that she does not remember what happened next, but apparently lost consciousness, fell, and was not found until 50 mins later. This episode was likely due to seizure as well considering her witnessed generalized seizure in the ED. Possible etiologies include metabolic, intracranial pathology, stroke/TIA, infection, arrhythmia, or medication related. She has uncontrolled HTN that may have played a role considering that she has not taken her medication regularly the last few days. She is on tramadol prn which lowers seizure threshold. She is also taking clonidine which can cause a rebound HTN if she has stopped taking. CT head negative for mass lesions or hemorrhage. -Telemetry -MRI W Wo contrast unremarkable -EEG normal -Trop trend 0.04, 0.41, 0.37, 0.22 (elevation possibly 2/2 seizure) -IV Ativan 1 mg prn for seizures -IV Keppra 500 mg BID -Magnesium = 1.7 -HIV = negative -UDS = negative -EtOH = negative - TSH 0.799 -Clear liquid diet -avoid opioids -Neurology has cleared patient, to follow up outpatient -Avoid driving, swimming, operating heavy machinery  Right knee and hip pain: Acute on chronic, patient fell on initial incident. X-ray of bilateral knees negative for fracture. -Right hip xray = no acute abnormality -PT -D/c tramadol for pain -Tramadol 20 mg once, 10 mg q4h prn afterwards  HTN:  Uncontrolled in the past, controlled after admission -Continue clonidine, benazepril, amlodipine -d/c telmesartan -med reconciliation  DM: Uncontrolled at home, on 5 oral hypoglycemics, patient resistant to insulin use, A1C 11.1 -SSI-M -Diabetes coordinator consult -med reconciliation  Prior TIA: -Continue Plavix 75 mg daily   Dispo: Disposition is deferred at this time, awaiting improvement of current medical problems.  Anticipated discharge in approximately 0-1 day(s).   The patient does have a current PCP Allyson Sabal, MD) and does not need an [facility] hospital follow-up appointment after discharge.  The patient does have transportation limitations that hinder transportation to clinic appointments. Should not drive until cleared by PCP/Neuro.  Marland Kitchen  Services Needed at time of discharge: Y = Yes, Blank = No PT:   OT:   RN:   Equipment:   Other:     LOS: 1 day   Darreld Mclean, MD 06/03/2015, 10:19 AM

## 2015-06-03 NOTE — Evaluation (Signed)
Physical Therapy Evaluation Patient Details Name: Christy Potter MRN: [id number] DOB: Jun 04, 1956 Today's Date: 06/03/2015   History of Present Illness  59 year old woman with poorly controlled hypertension on 5 antihypertensive medications (not clear that she is taking all of these), poorly controlled diabetes on 4 different medications but no insulin, associated diabetic nephropathy on Lyrica, cerebrovascular disease status post previous TIA on Plavix, and degenerative arthritis who was found on the floor unconscious in the bathroom where she works night shift at the [employer]. When she woke up slurred speech was noted. Only thing she remembers was having severe cramping of her right hand and arm. She was transported to the emergency department where she had a witnessed grand mal seizure.  Clinical Impression  Patient presents with decreased mobility secondary to right knee pain.  Will benefit from follow up HHPT at d/c and walker for home.  Educated on use of ice and ace wrap initially and in knee brace options if needed down the road.  Patient stable for d/c home today if ready, no further skilled acute PT needs at this time.    Follow Up Recommendations Home health PT;Supervision - Intermittent    Equipment Recommendations  Rolling walker with 5" wheels    Recommendations for Other Services       Precautions / Restrictions Precautions Precautions: Fall      Mobility  Bed Mobility Overal bed mobility: Needs Assistance Bed Mobility: Supine to Sit;Sit to Supine     Supine to sit: Min guard Sit to supine: Min guard   General bed mobility comments: assist for right LE due to pain  Transfers Overall transfer level: Needs assistance Equipment used: Rolling walker (2 wheeled) Transfers: Sit to/from Stand Sit to Stand: Supervision         General transfer comment: initially min assist given, then up from bed and toilet without assist  Ambulation/Gait   Ambulation  Distance (Feet): 20 Feet (x 2) Assistive device: Rolling walker (2 wheeled) Gait Pattern/deviations: Step-through pattern;Antalgic     General Gait Details: one episode right knee buckling use of walker for stabilization; able to stand in bathroom unaided to perform perineal hygiene  Stairs            Wheelchair Mobility    Modified Rankin (Stroke Patients Only)       Balance Overall balance assessment: Needs assistance         Standing balance support: During functional activity;No upper extremity supported Standing balance-Leahy Scale: Fair Standing balance comment: stood for perineal hygiene in bathroom, but needed walker for support for ambulation due to right LE pain                             Pertinent Vitals/Pain Pain Assessment: 0-10 Pain Score: 9  Pain Location: right knee Pain Descriptors / Indicators: Aching Pain Intervention(s): Limited activity within patient's tolerance;Monitored during session;Ice applied    Home Living Family/patient expects to be discharged to:: Private residence Living Arrangements: Children Available Help at Discharge: Family Type of Home: House Home Access: Stairs to enter Entrance Stairs-Rails: None Entrance Stairs-Number of Steps: 1.5 Home Layout: One level Home Equipment: Walker - standard;Cane - quad Additional Comments: lives with disabiled son. Son is able to go shopping , basic house choices and helps don pt socks daily    Prior Function Level of Independence: Independent with assistive device(s)         Comments: using quad cane when going  out     Hand Dominance        Extremity/Trunk Assessment               Lower Extremity Assessment: RLE deficits/detail RLE Deficits / Details: AROM limited knee flexion to about 60 in supine (more sitting dangling at edge of bed), strength NT due to pain; ligamentous testing M/LCL and A/PCL seem intact, point tender medial aspect of knee along joint  line       Communication   Communication: No difficulties  Cognition Arousal/Alertness: Awake/alert Behavior During Therapy: WFL for tasks assessed/performed Overall Cognitive Status: Within Functional Limits for tasks assessed                      General Comments      Exercises General Exercises - Lower Extremity Ankle Circles/Pumps: AROM;5 reps;Supine;Both Heel Slides: AAROM;5 reps;Both;Supine      Assessment/Plan    PT Assessment All further PT needs can be met in the next venue of care  PT Diagnosis Difficulty walking;Acute pain   PT Problem List Decreased range of motion;Decreased activity tolerance;Decreased strength;Pain;Decreased balance;Decreased mobility  PT Treatment Interventions     PT Goals (Current goals can be found in the Care Plan section) Acute Rehab PT Goals Patient Stated Goal: To go home PT Goal Formulation: All assessment and education complete, DC therapy    Frequency     Barriers to discharge        Co-evaluation               End of Session Equipment Utilized During Treatment: Gait belt Activity Tolerance: Patient limited by pain Patient left: in bed;with call bell/phone within reach;with family/visitor present;Other (comment) (with ice on knee)      Functional Assessment Tool Used: Clinical Judgement Functional Limitation: Mobility: Walking and moving around Mobility: Walking and Moving Around Current Status [phone]): At least 20 percent but less than 40 percent impaired, limited or restricted Mobility: Walking and Moving Around Goal Status [phone]): At least 1 percent but less than 20 percent impaired, limited or restricted Mobility: Walking and Moving Around Discharge Status [phone]): At least 1 percent but less than 20 percent impaired, limited or restricted    Time: 1458-1525 PT Time Calculation (min) (ACUTE ONLY): 27 min   Charges:   PT Evaluation $Initial PT Evaluation Tier I: 1 Procedure PT Treatments $Gait  Training: 8-22 mins   PT G Codes:   PT G-Codes **NOT FOR INPATIENT CLASS** Functional Assessment Tool Used: Clinical Judgement Functional Limitation: Mobility: Walking and moving around Mobility: Walking and Moving Around Current Status ([id number]): At least 20 percent but less than 40 percent impaired, limited or restricted Mobility: Walking and Moving Around Goal Status [phone]): At least 1 percent but less than 20 percent impaired, limited or restricted Mobility: Walking and Moving Around Discharge Status [phone]): At least 1 percent but less than 20 percent impaired, limited or restricted    [facility] 06/03/2015, 4:10 PM  [place], [place] 06/03/2015

## 2015-06-03 NOTE — Progress Notes (Deleted)
PT Cancellation Note  Patient Details Name: Christy Potter MRN: [id number] DOB: 1956/03/27   Cancelled Treatment:    Reason Eval/Treat Not Completed: Patient at procedure or test/unavailable.  Pt at EEG and will check back as time allows.   Ivar Drape 06/03/2015, 10:53 AM  Samul Dada, PT MS Acute Rehab Dept. Number: [id number] and [facility] [phone]

## 2015-06-03 NOTE — Progress Notes (Signed)
PT Cancellation Note  Patient Details Name: Christy Potter MRN: [id number] DOB: [birth year]   Cancelled Treatment:    Reason Eval/Treat Not Completed: Patient not medically ready. Awaiting an xray of R hip to rule out fracture.   Ivar Drape 06/03/2015, 10:58 AM   Samul Dada, PT MS Acute Rehab Dept. Number: [facility] [id number] and [facility] [phone]

## 2015-06-03 NOTE — Discharge Instructions (Signed)
Please follow up with [facility] neurology, Dr. Karel Jarvis, on Monday 06/07/2015 at 1 pm. Please arrive to clinic by 12:30 pm. If you are unable to make this appointment, please call the clinic at [phone] tomorrow to reschedule.  We have adjusted your medication so that you do not have to take so much. We will also add insulin lantus that will help keep your blood sugars at a better level.  Please be advised that you should not drive, operate heavy machinery, or swim until you are cleared by your primary care physician or neurologist.  I will also request home health with physical therapy and a walker to help with walking.  Seizure, Adult A seizure is abnormal electrical activity in the brain. Seizures usually last from 30 seconds to 2 minutes. There are various types of seizures. Before a seizure, you may have a warning sensation (aura) that a seizure is about to occur. An aura may include the following symptoms:   Fear or anxiety.  Nausea.  Feeling like the room is spinning (vertigo).  Vision changes, such as seeing flashing lights or spots. Common symptoms during a seizure include:  A change in attention or behavior (altered mental status).  Convulsions with rhythmic jerking movements.  Drooling.  Rapid eye movements.  Grunting.  Loss of bladder and bowel control.  Bitter taste in the mouth.  Tongue biting. After a seizure, you may feel confused and sleepy. You may also have an injury resulting from convulsions during the seizure. HOME CARE INSTRUCTIONS   If you are given medicines, take them exactly as prescribed by your health care provider.  Keep all follow-up appointments as directed by your health care provider.  Do not swim or drive or engage in risky activity during which a seizure could cause further injury to you or others until your health care provider says it is OK.  Get adequate rest.  Teach friends and family what to do if you have a seizure. They  should:  Lay you on the ground to prevent a fall.  Put a cushion under your head.  Loosen any tight clothing around your neck.  Turn you on your side. If vomiting occurs, this helps keep your airway clear.  Stay with you until you recover.  Know whether or not you need emergency care. SEEK IMMEDIATE MEDICAL CARE IF:  The seizure lasts longer than 5 minutes.  The seizure is severe or you do not wake up immediately after the seizure.  You have an altered mental status after the seizure.  You are having more frequent or worsening seizures. Someone should drive you to the emergency department or call local emergency services (911 in [place] MAKE SURE YOU:  Understand these instructions.  Will watch your condition.  Will get help right away if you are not doing well or get worse. Document Released: 10/06/2000 Document Revised: 07/30/2013 Document Reviewed: 05/21/2013 [facility] Patient Information 2015 [place], [place]. This information is not intended to replace advice given to you by your health care provider. Make sure you discuss any questions you have with your health care provider.

## 2015-06-03 NOTE — Progress Notes (Signed)
UR Completed Cyree Chuong Graves-Bigelow, RN,BSN [phone]  

## 2015-06-03 NOTE — Discharge Summary (Signed)
Name: Christy Potter MRN: [id number] DOB: Jun 24, 1956 59 y.o. PCP: Allyson Sabal, MD  Date of Admission: 06/02/2015  4:54 AM Date of Discharge: 06/03/2015 Attending Physician: Levert Feinstein, MD  Discharge Diagnosis: 1. Seizure  Principal Problem:   Seizure Active Problems:   Diabetes mellitus without complication   Hypertension   TIA (transient ischemic attack)   OSA on CPAP   Contusion, knee  Discharge Medications:   Medication List    STOP taking these medications        glimepiride 4 MG tablet  Commonly known as:  AMARYL     linagliptin 5 MG Tabs tablet  Commonly known as:  TRADJENTA     telmisartan-hydrochlorothiazide 80-25 MG per tablet  Commonly known as:  MICARDIS HCT     traMADol 50 MG tablet  Commonly known as:  ULTRAM      TAKE these medications        Alogliptin-Metformin HCl 12.02-999 MG Tabs  Take 1 tablet by mouth 2 (two) times daily with a meal.     amLODipine-benazepril 10-40 MG per capsule  Commonly known as:  LOTREL  Take 1 capsule by mouth daily.     atorvastatin 40 MG tablet  Commonly known as:  LIPITOR  Take 1 tablet (40 mg total) by mouth at bedtime.     carvedilol 25 MG tablet  Commonly known as:  COREG  Take 25 mg by mouth 2 (two) times daily with a meal.     celecoxib 200 MG capsule  Commonly known as:  CELEBREX  Take 200 mg by mouth 2 (two) times daily.     cloNIDine 0.1 MG tablet  Commonly known as:  CATAPRES  Take 0.1 mg by mouth 2 (two) times daily.     clopidogrel 75 MG tablet  Commonly known as:  PLAVIX  Take 75 mg by mouth daily.     furosemide 40 MG tablet  Commonly known as:  LASIX  Take 40 mg by mouth daily.     hydrochlorothiazide 25 MG tablet  Commonly known as:  HYDRODIURIL  Take 1 tablet (25 mg total) by mouth daily.     Insulin Glargine 100 UNIT/ML Solostar Pen  Commonly known as:  LANTUS  Inject 10 Units into the skin at bedtime.     levETIRAcetam 500 MG tablet  Commonly known as:   KEPPRA  Take 1 tablet (500 mg total) by mouth 2 (two) times daily.     potassium chloride SA 20 MEQ tablet  Commonly known as:  K-DUR,KLOR-CON  Take 20 mEq by mouth daily.     pregabalin 50 MG capsule  Commonly known as:  LYRICA  Take 50 mg by mouth at bedtime.     TRULICITY 1.5 MG/0.5ML Sopn  Generic drug:  Dulaglutide  Inject 0.5 mLs into the skin once a week.        Disposition and follow-up:   [name] was discharged from [facility] in Stable condition.  At the hospital follow up visit please address:  1.  Seizure: Please address whether the patient has any continued symptoms and is continuing her Keppra. She has follow up appointment scheduled at [facility] on 06/07/2015.  Diabetes: Please follow up on her medication adherence and insulin use as well as any further adjustments to her regimen as needed.  HTN: Please follow up on her blood pressure control and medication adherence.   2.  Labs / imaging needed at time of follow-up: consider CBG  3.  Pending labs/ test needing follow-up: none  Follow-up Appointments: Follow-up Information    Follow up with [facility], MD. Schedule an appointment as soon as possible for a visit in 2 weeks.   Specialty:  Family Medicine      Follow up with Van Clines, MD. Go on 06/07/2015.   Specialty:  Neurology   Contact information:   [address] [place] [address] [phone]       Discharge Instructions: Discharge Instructions    Call MD for:  difficulty breathing, headache or visual disturbances    Complete by:  As directed      Call MD for:  extreme fatigue    Complete by:  As directed      Call MD for:  persistant dizziness or light-headedness    Complete by:  As directed      Call MD for:  severe uncontrolled pain    Complete by:  As directed      Diet - low sodium heart healthy    Complete by:  As directed      Face-to-face encounter (required for  Medicare/Medicaid patients)    Complete by:  As directed   I Darreld Mclean certify that this patient is under my care and that I, or a nurse practitioner or physician's assistant working with me, had a face-to-face encounter that meets the physician face-to-face encounter requirements with this patient on 06/03/2015. The encounter with the patient was in whole, or in part for the following medical condition(s) which is the primary reason for home health care (List medical condition): Right hip and knee pain s/p seizure  The encounter with the patient was in whole, or in part, for the following medical condition, which is the primary reason for home health care:  Right hip/knee pain  I certify that, based on my findings, the following services are medically necessary home health services:  Physical therapy  Reason for Medically Necessary Home Health Services:  Therapy- Therapeutic Exercises to Increase Strength and Endurance  My clinical findings support the need for the above services:  Pain interferes with ambulation/mobility  Further, I certify that my clinical findings support that this patient is homebound due to:  Pain interferes with ambulation/mobility     Home Health    Complete by:  As directed   To provide the following care/treatments:  PT     Increase activity slowly    Complete by:  As directed      Walker rolling    Complete by:  As directed            Consultations:  Neurology  Procedures Performed:  Ct Head Wo Contrast  06/02/2015   CLINICAL DATA:  Acute onset of syncope.  Initial encounter.  EXAM: CT HEAD WITHOUT CONTRAST  TECHNIQUE: Contiguous axial images were obtained from the base of the skull through the vertex without intravenous contrast.  COMPARISON:  CT of the head and MRI of the brain performed 07/16/2014  FINDINGS: There is no evidence of acute infarction, mass lesion, or intra- or extra-axial hemorrhage on CT.  Mild periventricular white matter change likely reflects  small vessel ischemic microangiopathy.  The posterior fossa, including the cerebellum, brainstem and fourth ventricle, is within normal limits. The third and lateral ventricles, and basal ganglia are unremarkable in appearance. The cerebral hemispheres are symmetric in appearance, with normal gray-white differentiation. No mass effect or midline shift is seen.  There is no evidence of fracture; visualized osseous  structures are unremarkable in appearance. The visualized portions of the orbits are within normal limits. There is mild partial opacification of the right side of the sphenoid sinus. The remaining paranasal sinuses and mastoid air cells are well-aerated. No significant soft tissue abnormalities are seen.  IMPRESSION: 1. No acute intracranial pathology seen on CT. 2. Mild small vessel ischemic microangiopathy. 3. Mild partial opacification of the right side of the sphenoid sinus.   Electronically Signed   By: Roanna Raider M.D.   On: 06/02/2015 06:24   Mr Laqueta Jean ZO Contrast  06/02/2015   CLINICAL DATA:  New onset seizure. Syncopal episode at work. Witnessed generalized seizure in the Emergency Department.  EXAM: MRI HEAD WITHOUT AND WITH CONTRAST  TECHNIQUE: Multiplanar, multiecho pulse sequences of the brain and surrounding structures were obtained without and with intravenous contrast.  CONTRAST:  20mL MULTIHANCE GADOBENATE DIMEGLUMINE 529 MG/ML IV SOLN  COMPARISON:  CT head from the same day.  MRI brain 07/16/2014.  FINDINGS: The study is mildly degraded by patient motion. No acute infarct is present. Moderate periventricular and subcortical T2 changes bilaterally are stable. The ventricles are of normal size. No significant extra-axial fluid collection is present.  Multiple foci of susceptibility are again scattered throughout the brain, predominantly gray-white junction. Is are somewhat obscured by patient motion. No definite new lesions are present. There is no significant expansion of the  lesions.  Dedicated imaging of the temporal lobes demonstrates symmetric size and signal of the hippocampal structures bilaterally.  Flow is present in the major intracranial arteries. The globes and orbits are intact. The paranasal sinuses are clear. There is some fluid in the right mastoid air cells. No obstructing nasopharyngeal lesion is evident.  IMPRESSION: A  1. No acute intracranial abnormality or significant interval change. No acute or focal lesion to explain seizures. 2. Mild age advanced atrophy and white matter disease. 3. Scattered foci of susceptibility along the gray-white junction are not significantly changed from prior study. These are likely related to a chronic vasculopathy more hypertension.   Electronically Signed   By: Marin Roberts M.D.   On: 06/02/2015 16:56   Dg Knee Complete 4 Views Left  06/02/2015   CLINICAL DATA:  Status post fall, with left knee pain. Initial encounter.  EXAM: LEFT KNEE - COMPLETE 4+ VIEW  COMPARISON:  None.  FINDINGS: There is no evidence of fracture or dislocation. Mild medial compartment narrowing is noted. Marginal osteophytes are seen arising at all three compartments. Tibial spine osteophytes are also noted. A fabella is noted.  A small to moderate knee joint effusion is suspected. The visualized soft tissues are normal in appearance.  IMPRESSION: 1. No evidence of fracture or dislocation. 2. Small to moderate knee joint effusion noted. 3. Mild tricompartmental osteoarthritis, with mild medial compartment narrowing.   Electronically Signed   By: Roanna Raider M.D.   On: 06/02/2015 06:40   Dg Knee Complete 4 Views Right  06/02/2015   CLINICAL DATA:  Status post fall, with right knee pain. Initial encounter.  EXAM: RIGHT KNEE - COMPLETE 4+ VIEW  COMPARISON:  None.  FINDINGS: There is no evidence of fracture or dislocation. The joint spaces are preserved. Marginal osteophytes are seen arising at all three compartments, and at the tibial spine.  No  significant joint effusion is seen. The visualized soft tissues are normal in appearance.  IMPRESSION: 1. No evidence of fracture or dislocation. 2. Mild tricompartmental osteoarthritis noted.   Electronically Signed   By: Leotis Shames  Chang M.D.   On: 06/02/2015 06:41   Dg Hip Unilat With Pelvis 2-3 Views Right  06/03/2015   CLINICAL DATA:  Fall.  Pain.  Initial evaluation .  EXAM: DG HIP (WITH OR WITHOUT PELVIS) 2-3V RIGHT  COMPARISON:  None.  FINDINGS: Degenerative changes lumbar spine and both hips. No acute abnormality identified. No evidence of fracture or dislocation.  IMPRESSION: No acute abnormality.   Electronically Signed   By: Maisie Fus  Register   On: 06/03/2015 14:07    2D Echo: 07/17/2015 - Left ventricle: suggestive of elevated LV fillingpressures. The cavity size was normal. Systolic function wasnormal. The estimated ejection fraction was in the range of 55%to 60%. Wall motion was normal; there were no regional wall motion abnormalities. There was an increased relativecontribution of atrial contraction to ventricular filling. Doppler parameters are consistent with abnormal left ventricularrelaxation (grade 1 diastolic dysfunction). - Left atrium: The atrium was moderately dilated. - Pulmonic valve: There was trivial regurgitation.  Cardiac Cath: none  Admission HPI: 68 Y O F with PMH of HTN, DM, TIA, OSA, presented today with complaints of Loss of consciousness and later seizures in th ED. Pt was in her normal state of health until early this morning, when she was found in the restroom, passed out on the floor, by her Co-worker. Pt works for the [employer]. Patient though drowsy at the time of my history but says she remembers when she passed out. She says she stood up and had this cramping in her right hand, and that's the last she rememebers. Per family and co-workers, she gets off at 3.30am, she went to the bathroom, and was found by her co-worker at 4.19am. Coworker noted her speech was  slurred initially, when she came to. Patient denies having prior chest pain, SOB, abnormal heart rhythm or palpitations, headaches or weakness of her extremities. Per coworker and family, no trauma to tongue observed, foaming, fecal or urinary incontinence.  She was brought to the ED, and about 7.30 this am, she had a generalized tonic clonic seizure- lasting ~2-3 mins, aborted with  of ativan. Prior to seizure, daughter says patient was back to baseline, she started complaining of some right handed pain and cramping, patients hand assumed an unusual flexed position before the seizure became generalized. No trauma to tongue observed, foaming, fecal or urinary incontinence.   Patient has no prior hx of seizures, does not take alcohol, use drugs, or smoke cigs, lives with her son, and per family has not had any complaints of headaches, neck stiffness , fever, weightloss, diarrhea, vomiting, cough, SOB or any other complaints listed above. She has had right knee pain which is chronic.  In the Ed prior to seizure, head CT was done, which was unremarkable. Neurology was consulted, patient was loaded with 1g of Keppra, and IMTS was called to admit.   Hospital Course by problem list: Principal Problem:   Seizure Active Problems:   Diabetes mellitus without complication   Hypertension   TIA (transient ischemic attack)   OSA on CPAP   Contusion, knee   Seizure: Initial CT w/o contrast showed no evidence of an acute bleed. The patient was completely asymptomatic during this time.  Neurology was consulted after patient experienced the local to generalized seizure in the emergency department. An EKG was obtained that showed no changes since last admission and initial troponin was 0.04, though serial troponins showed it rise to 0.41 that evening (8/10) before trending down. She was loaded on 1g of Keppra  and then started on a 500mg  BID regiment. An EEG was performed that showed no apparent abnormalities. An  MRI with and without contrast was then obtained, which showed no new lesions and some evidence of either a chronic vasculopathy or hypertension. No more seizure activity was observed or noted by the patient or healthcare providers, and the patient will follow up with outpatient neurology.  HTN: : Upon admission, the patient's blood pressures were in the 170's mmHg systolic. The patient was noted to be on multiple blood pressure medications, some in the same class as one another. She was started on amlodipine 10mg  daily, benazepril 40mg  daily, carvedilol 25mg  BID, and clonidine 0.1mg  BID, and the rest of her antihypertensives were held. Her systolic blood pressures ranged from the 150s to the 170s during her hospital stay. Reviewing her medications with her pharmacy, it was noted that multiple providers had been prescribing the different blood pressure medications. Adjustments were made to her medications on discharge and will defer further changes to her PCP.  Diabetes mellitus: Blood glucose on admission was 373 mg/dL, and her hemoglobin N5A was 11.1%. She was currently taking 5 oral diabetic drugs, linagliptin, glimepiride, dulaglutide, alogliptin, and metformin. The medications were held and she was placed on a sliding scale insulin regiment. Diabetes coordinator recommended a regiment of once daily basal insulin along with her weekly alogliptin and BID metformin. She has agreed to start Lantus insulin 10 units once daily.  Contusion, knee: The patient has a history of chronic R knee pain due to osteoarthritis, for which she takes Ultram at home. She noted a worsening of the pain of late after she fell during her initial syncopal/seizure episode prior to hospital visit. Xray imaging was obtained of both knees, which was unremarkable. The patient also complained of associated R hip pain, for which xrays were also unremarkable. PT has recommended home health pt as well as use of rolling walker, which were  both ordered on discharge.  Discharge Vitals:   BP 139/68 mmHg  Pulse 76  Temp(Src) 98.9 F (37.2 C) (Oral)  Resp 18  Wt 283 lb 14.4 oz (128.776 kg)  SpO2 96%  Discharge Labs:  Results for orders placed or performed during the hospital encounter of 06/02/15 (from the past 24 hour(s))  CBG monitoring, ED     Status: Abnormal   Collection Time: 06/02/15  6:14 PM  Result Value Ref Range   Glucose-Capillary 158 (H) 65 - 99 mg/dL  Hemoglobin O1H     Status: Abnormal   Collection Time: 06/02/15  7:10 PM  Result Value Ref Range   Hgb A1c MFr Bld 11.1 (H) 4.8 - 5.6 %   Mean Plasma Glucose 272 mg/dL  TSH     Status: None   Collection Time: 06/02/15  7:10 PM  Result Value Ref Range   TSH 0.799 0.350 - 4.500 uIU/mL  Troponin I     Status: Abnormal   Collection Time: 06/02/15  7:10 PM  Result Value Ref Range   Troponin I 0.41 (H) <0.031 ng/mL  Ethanol     Status: None   Collection Time: 06/02/15  7:10 PM  Result Value Ref Range   Alcohol, Ethyl (B) <5 <5 mg/dL  HIV antibody     Status: None   Collection Time: 06/02/15  7:10 PM  Result Value Ref Range   HIV Screen 4th Generation wRfx Non Reactive Non Reactive  Glucose, capillary     Status: Abnormal   Collection Time: 06/02/15  8:04  PM  Result Value Ref Range   Glucose-Capillary 206 (H) 65 - 99 mg/dL  Glucose, capillary     Status: Abnormal   Collection Time: 06/03/15 12:04 AM  Result Value Ref Range   Glucose-Capillary 245 (H) 65 - 99 mg/dL  Troponin I     Status: Abnormal   Collection Time: 06/03/15  1:03 AM  Result Value Ref Range   Troponin I 0.37 (H) <0.031 ng/mL  Glucose, capillary     Status: Abnormal   Collection Time: 06/03/15  4:51 AM  Result Value Ref Range   Glucose-Capillary 152 (H) 65 - 99 mg/dL  Glucose, capillary     Status: Abnormal   Collection Time: 06/03/15  7:27 AM  Result Value Ref Range   Glucose-Capillary 162 (H) 65 - 99 mg/dL  Troponin I     Status: Abnormal   Collection Time: 06/03/15  7:49 AM    Result Value Ref Range   Troponin I 0.22 (H) <0.031 ng/mL  Glucose, capillary     Status: Abnormal   Collection Time: 06/03/15 12:14 PM  Result Value Ref Range   Glucose-Capillary 172 (H) 65 - 99 mg/dL  Glucose, capillary     Status: Abnormal   Collection Time: 06/03/15  4:20 PM  Result Value Ref Range   Glucose-Capillary 323 (H) 65 - 99 mg/dL    Signed: Darreld Mclean, MD 06/03/2015, 6:07 PM    Services Ordered on Discharge: home health PT Equipment Ordered on Discharge: rolling walker

## 2015-06-03 NOTE — Progress Notes (Signed)
INTERNAL MEDICINE TEACHING SERVICE Night Float Progress Note   Subjective:    We were called overnight by the RN for evaluation of troponin elevation of 0.41. Patient admitted this AM for LOC at work, thought to be focal --> generalized seizure. Patient evaluated at the bedside overnight, patient sleeping soundly, does not report any chest pain today or recently.   Telemetry reviewed, infrequent PVC's, otherwise no significant findings.    Objective:    BP 171/82 mmHg  Pulse 86  Temp(Src) 98.6 F (37 C) (Oral)  Resp 20  SpO2 95%   Labs: Basic Metabolic Panel:    Component Value Date/Time   NA 140 06/02/2015 0653   K 3.4* 06/02/2015 0653   CL 99* 06/02/2015 0653   CO2 27 06/02/2015 0653   BUN 13 06/02/2015 0653   CREATININE 0.85 06/02/2015 0653   GLUCOSE 373* 06/02/2015 0653   CALCIUM 8.8* 06/02/2015 0653    CBC:    Component Value Date/Time   WBC 10.4 06/02/2015 0653   HGB 12.5 06/02/2015 0653   HCT 36.9 06/02/2015 0653   PLT 203 06/02/2015 0653   MCV 88.9 06/02/2015 0653   NEUTROABS 5.4 07/15/2014 2202   LYMPHSABS 2.1 07/15/2014 2202   MONOABS 0.5 07/15/2014 2202   EOSABS 0.1 07/15/2014 2202   BASOSABS 0.0 07/15/2014 2202    Cardiac Enzymes: Lab Results  Component Value Date   TROPONINI 0.41* 06/02/2015    Physical Exam: General: Vital signs reviewed and noted. Obese, well-developed, well-nourished, in no acute distress; alert, appropriate and cooperative throughout examination.   Lungs:  Normal respiratory effort. Clear to auscultation BL without crackles or wheezes.  Heart: RRR. S1 and S2 normal without gallop, murmur, or rubs.  Abdomen:  BS normoactive. Soft, Nondistended, non-tender.  No masses or organomegaly.  Extremities: +1 pretibial edema.     Assessment/ Plan:    59 y/o F w/ PMHx of HTN, HLD, OSA on CPAP, and chronic dCHF, admitted for new onset seizures.   Elevated Troponin: Patient w/ POC troponin 0.04 on admission, now w/ troponin of  0.41. EKG shows anterior Q-waves, LVH, and borderline prolonged QTc, all findings present on previous EKG. Suspect troponin elevation is related to seizure activity/demand ischemia in the setting of significant exertion during seizure episodes. Patient reports no recent chest pain and states she has mild SOB but no different from her baseline. Patient does have HEART score of 5 given family history, PMHx, old EKG changes, age, and troponin elevation. Telemetry shows NSR w/ infrequent PVC's.  -Will repeat EKG; if acute changes, will consider starting heparin gtt -Continue to cycle cardiac enzymes x2 -Given information above (HEART score, old Q-waves, troponin elevation), may be reasonable to consult cardiology in AM to consider stress test -Continue BP control; Coreg, Clonidine, Benazepril, Norvasc -Continue Plavix (on for previous TIA), statin -Continue telemetry  Courtney Paris, MD  06/03/2015, 12:31 AM

## 2015-06-03 NOTE — Progress Notes (Addendum)
Subjective: No further seizures and no apparent side effects from Keppra.   Objective: Current vital signs: BP 154/86 mmHg  Pulse 82  Temp(Src) 98.4 F (36.9 C) (Oral)  Resp 18  Wt 128.776 kg (283 lb 14.4 oz)  SpO2 92% Vital signs in last 24 hours: Temp:  [98.4 F (36.9 C)-98.6 F (37 C)] 98.4 F (36.9 C) (08/11 0454) Pulse Rate:  [82-95] 82 (08/11 0454) Resp:  [17-29] 18 (08/11 0454) BP: (140-185)/(75-134) 154/86 mmHg (08/11 0454) SpO2:  [92 %-99 %] 92 % (08/11 0454) Weight:  [128.776 kg (283 lb 14.4 oz)] 128.776 kg (283 lb 14.4 oz) (08/11 0454)  Intake/Output from previous day: 08/10 0701 - 08/11 0700 In: 240 [P.O.:240] Out: -  Intake/Output this shift:   Nutritional status: Diet clear liquid Room service appropriate?: Yes; Fluid consistency:: Thin  Neurologic Exam: General: Mental Status: Alert, oriented, thought content appropriate.  Speech fluent without evidence of aphasia.  Able to follow 3 step commands without difficulty. Cranial Nerves: II: Discs flat bilaterally; Visual fields grossly normal, pupils equal, round, reactive to light and accommodation III,IV, VI: ptosis not present, extra-ocular motions intact bilaterally V,VII: smile symmetric, facial light touch sensation normal bilaterally VIII: hearing normal bilaterally IX,X: uvula rises symmetrically XI: bilateral shoulder shrug XII: midline tongue extension without atrophy or fasciculations  Motor: Right : Upper extremity   5/5    Left:     Upper extremity   5/5  Lower extremity   5/5     Lower extremity   5/5 Tone and bulk:normal tone throughout; no atrophy noted Sensory: Pinprick and light touch intact throughout, bilaterally Deep Tendon Reflexes:  1+ throughout  Plantars: Mute bilaterally    Lab Results: Basic Metabolic Panel:  Recent Labs Lab 06/02/15 0645 06/02/15 0653  NA  --  140  K  --  3.4*  CL  --  99*  CO2  --  27  GLUCOSE  --  373*  BUN  --  13  CREATININE  --  0.85   CALCIUM  --  8.8*  MG 1.7  --     Liver Function Tests:  Recent Labs Lab 06/02/15 0617  AST 38  ALT 22  ALKPHOS 68  BILITOT 1.2  PROT 7.0  ALBUMIN 3.4*   No results for input(s): LIPASE, AMYLASE in the last 168 hours. No results for input(s): AMMONIA in the last 168 hours.  CBC:  Recent Labs Lab 06/02/15 0653  WBC 10.4  HGB 12.5  HCT 36.9  MCV 88.9  PLT 203    Cardiac Enzymes:  Recent Labs Lab 06/02/15 1910 06/03/15 0103 06/03/15 0749  TROPONINI 0.41* 0.37* 0.22*    Lipid Panel: No results for input(s): CHOL, TRIG, HDL, CHOLHDL, VLDL, LDLCALC in the last 168 hours.  CBG:  Recent Labs Lab 06/02/15 1814 06/02/15 2004 06/03/15 0004 06/03/15 0451 06/03/15 0727  GLUCAP 158* 206* 245* 152* 162*    Microbiology: No results found for this or any previous visit.  Coagulation Studies: No results for input(s): LABPROT, INR in the last 72 hours.  Imaging: Ct Head Wo Contrast  06/02/2015   CLINICAL DATA:  Acute onset of syncope.  Initial encounter.  EXAM: CT HEAD WITHOUT CONTRAST  TECHNIQUE: Contiguous axial images were obtained from the base of the skull through the vertex without intravenous contrast.  COMPARISON:  CT of the head and MRI of the brain performed 07/16/2014  FINDINGS: There is no evidence of acute infarction, mass lesion, or intra- or extra-axial hemorrhage  on CT.  Mild periventricular white matter change likely reflects small vessel ischemic microangiopathy.  The posterior fossa, including the cerebellum, brainstem and fourth ventricle, is within normal limits. The third and lateral ventricles, and basal ganglia are unremarkable in appearance. The cerebral hemispheres are symmetric in appearance, with normal gray-white differentiation. No mass effect or midline shift is seen.  There is no evidence of fracture; visualized osseous structures are unremarkable in appearance. The visualized portions of the orbits are within normal limits. There is mild  partial opacification of the right side of the sphenoid sinus. The remaining paranasal sinuses and mastoid air cells are well-aerated. No significant soft tissue abnormalities are seen.  IMPRESSION: 1. No acute intracranial pathology seen on CT. 2. Mild small vessel ischemic microangiopathy. 3. Mild partial opacification of the right side of the sphenoid sinus.   Electronically Signed   By: Roanna Raider M.D.   On: 06/02/2015 06:24   Mr Laqueta Jean ZO Contrast  06/02/2015   CLINICAL DATA:  New onset seizure. Syncopal episode at work. Witnessed generalized seizure in the Emergency Department.  EXAM: MRI HEAD WITHOUT AND WITH CONTRAST  TECHNIQUE: Multiplanar, multiecho pulse sequences of the brain and surrounding structures were obtained without and with intravenous contrast.  CONTRAST:  20mL MULTIHANCE GADOBENATE DIMEGLUMINE 529 MG/ML IV SOLN  COMPARISON:  CT head from the same day.  MRI brain 07/16/2014.  FINDINGS: The study is mildly degraded by patient motion. No acute infarct is present. Moderate periventricular and subcortical T2 changes bilaterally are stable. The ventricles are of normal size. No significant extra-axial fluid collection is present.  Multiple foci of susceptibility are again scattered throughout the brain, predominantly gray-white junction. Is are somewhat obscured by patient motion. No definite new lesions are present. There is no significant expansion of the lesions.  Dedicated imaging of the temporal lobes demonstrates symmetric size and signal of the hippocampal structures bilaterally.  Flow is present in the major intracranial arteries. The globes and orbits are intact. The paranasal sinuses are clear. There is some fluid in the right mastoid air cells. No obstructing nasopharyngeal lesion is evident.  IMPRESSION: A  1. No acute intracranial abnormality or significant interval change. No acute or focal lesion to explain seizures. 2. Mild age advanced atrophy and white matter disease. 3.  Scattered foci of susceptibility along the gray-white junction are not significantly changed from prior study. These are likely related to a chronic vasculopathy more hypertension.   Electronically Signed   By: Marin Roberts M.D.   On: 06/02/2015 16:56   Dg Knee Complete 4 Views Left  06/02/2015   CLINICAL DATA:  Status post fall, with left knee pain. Initial encounter.  EXAM: LEFT KNEE - COMPLETE 4+ VIEW  COMPARISON:  None.  FINDINGS: There is no evidence of fracture or dislocation. Mild medial compartment narrowing is noted. Marginal osteophytes are seen arising at all three compartments. Tibial spine osteophytes are also noted. A fabella is noted.  A small to moderate knee joint effusion is suspected. The visualized soft tissues are normal in appearance.  IMPRESSION: 1. No evidence of fracture or dislocation. 2. Small to moderate knee joint effusion noted. 3. Mild tricompartmental osteoarthritis, with mild medial compartment narrowing.   Electronically Signed   By: Roanna Raider M.D.   On: 06/02/2015 06:40   Dg Knee Complete 4 Views Right  06/02/2015   CLINICAL DATA:  Status post fall, with right knee pain. Initial encounter.  EXAM: RIGHT KNEE - COMPLETE 4+ VIEW  COMPARISON:  None.  FINDINGS: There is no evidence of fracture or dislocation. The joint spaces are preserved. Marginal osteophytes are seen arising at all three compartments, and at the tibial spine.  No significant joint effusion is seen. The visualized soft tissues are normal in appearance.  IMPRESSION: 1. No evidence of fracture or dislocation. 2. Mild tricompartmental osteoarthritis noted.   Electronically Signed   By: Roanna Raider M.D.   On: 06/02/2015 06:41    Medications:  Scheduled: . amLODipine  10 mg Oral Daily  . atorvastatin  40 mg Oral QHS  . benazepril  40 mg Oral Daily  . carvedilol  25 mg Oral BID WC  . cloNIDine  0.1 mg Oral BID  . clopidogrel  75 mg Oral Daily  . enoxaparin (LOVENOX) injection  40 mg  Subcutaneous Q24H  . insulin aspart  0-15 Units Subcutaneous 6 times per day  . levETIRAcetam  500 mg Intravenous Q12H  . LORazepam  1 mg Intravenous Once  . pregabalin  50 mg Oral QHS   EEG: Impression: this is a normal awake and asleep EEG. Please, be aware that a normal EEG does not exclude the possibility of epilepsy.   Assessment/Plan:  59 year old lady with diabetes mellitus, hypertension, hyperlipidemia and history of TIAs, presenting with new onset generalized seizures. Etiology is unclear. EEG was normal and MRI of the brain were unremarkable.   Recommend: 1) Continue Keppra 500 mg BID and may change to PO when taking pills 2) No driving, operating heavy machinery, perform activities at heights, swimming or participation in water activities until release by outpatient physician.  This has been discussed with patient.  3) follow up with out patient neurology  Neurology will sign off normal but remain available for follow-up if indicated.  Felicie Morn PA-C [facility] [phone]  06/03/2015, 10:18 AM  I personally participated in this patient's evaluation and management, including formulating above clinical impression and management recommendations.  Venetia Maxon M.D. [facility] [phone]

## 2015-06-03 NOTE — Care Management Note (Signed)
Case Management Note  Patient Details  Name: Christy Potter MRN: [id number] Date of Birth: [birth year]  Subjective/Objective:  Pt admitted for New onset seizures. Initiated on IV Keppra.  Pt with poorly controlled hypertension and diabetes on multiple medications. Pt has insurance.                Action/Plan: CM will continue to monitor for additional needs.    Expected Discharge Date:                  Expected Discharge Plan:  Home/Self Care  In-House Referral:     Discharge planning Services  CM Consult  Post Acute Care Choice:    Choice offered to:     DME Arranged:    DME Agency:     HH Arranged:    HH Agency:     Status of Service:  In process, will continue to follow  Medicare Important Message Given:    Date Medicare IM Given:    Medicare IM give by:    Date Additional Medicare IM Given:    Additional Medicare Important Message give by:     If discussed at Long Length of Stay Meetings, dates discussed:    Additional Comments:  Gala Lewandowsky, RN 06/03/2015, 12:10 PM

## 2015-06-04 NOTE — Care Management Note (Signed)
Case Management Note  Patient Details  Name: Samaia Iwata MRN: [id number] Date of Birth: January 17, 1956  Subjective/Objective:    Late entry_ Pt d/c on yesterday and needed [facility] services. CM did call pt and pt is agreeable to services.                Action/Plan: CM set pt up with [facility] for PT services. DME to be delivered to pt via [facility]- mail. CM did call to make pt aware. No further needs identified at this time.    Expected Discharge Date:                  Expected Discharge Plan:  Home w Home Health Services  In-House Referral:     Discharge planning Services  CM Consult  Post Acute Care Choice:  Durable Medical Equipment, Home Health Choice offered to:  Patient  DME Arranged:  Dan Humphreys DME Agency:  [facility]  HH Arranged:  PT [facility] Agency:  [facility]  Status of Service:  Completed, signed off  Medicare Important Message Given:    Date Medicare IM Given:    Medicare IM give by:    Date Additional Medicare IM Given:    Additional Medicare Important Message give by:     If discussed at Long Length of Stay Meetings, dates discussed:    Additional Comments:  Gala Lewandowsky, RN 06/04/2015, 11:48 AM

## 2015-06-07 ENCOUNTER — Ambulatory Visit (INDEPENDENT_AMBULATORY_CARE_PROVIDER_SITE_OTHER): Payer: Federal, State, Local not specified - PPO | Admitting: Neurology

## 2015-06-07 ENCOUNTER — Encounter: Payer: Self-pay | Admitting: Neurology

## 2015-06-07 VITALS — BP 138/82 | HR 47 | Ht 60.0 in | Wt 269.0 lb

## 2015-06-07 DIAGNOSIS — R569 Unspecified convulsions: Secondary | ICD-10-CM | POA: Diagnosis not present

## 2015-06-07 MED ORDER — LEVETIRACETAM 500 MG PO TABS: 500.0000 mg | ORAL_TABLET | Freq: Two times a day (BID) | ORAL | Status: DC

## 2015-06-07 NOTE — Patient Instructions (Signed)
1. Continue Keppra  twice a day 2. As per [place] driving laws, no driving after a seizure, until 6 months seizure-free 3. Control of sugar and BP are important for brain health 4. Follow-up in 3 months, call our office for any change in symptoms  Seizure Precautions: 1. If medication has been prescribed for you to prevent seizures, take it exactly as directed.  Do not stop taking the medicine without talking to your doctor first, even if you have not had a seizure in a long time.   2. Avoid activities in which a seizure would cause danger to yourself or to others.  Don't operate dangerous machinery, swim alone, or climb in high or dangerous places, such as on ladders, roofs, or girders.  Do not drive unless your doctor says you may.  3. If you have any warning that you may have a seizure, lay down in a safe place where you can't hurt yourself.    4.  No driving for 6 months from last seizure, as per [facility].   Please refer to the following link on the [employer]ile[employer] of [employer]'s website for more information: http://www.epilepsyfoundation.org/answerplace/Social/driving/drivingu.cfm   5.  Maintain good sleep hygiene.  6.  Contact your doctor if you have any problems that may be related to the medicine you are taking.  7.  Call 911 and bring the patient back to the ED if:        A.  The seizure lasts longer than 5 minutes.       B.  The patient doesn't awaken shortly after the seizure  C.  The patient has new problems such as difficulty seeing, speaking or moving  D.  The patient was injured during the seizure  E.  The patient has a temperature over 102 F (39C)  F.  The patient vomited and now is having trouble breathing

## 2015-06-07 NOTE — Progress Notes (Signed)
NEUROLOGY CONSULTATION NOTE  Christy Potter MRN: [id number] DOB: June 23, 1956  Referring provider: Dr. Allyson Potter Primary care provider: Dr. Allyson Potter  Reason for consult:  seizure  Dear Dr Christy Potter:  Thank you for your kind referral of Christy Potter for consultation of the above symptoms. Although her history is well known to you, please allow me to reiterate it for the purpose of our medical record. Records and images were personally reviewed where available.  HISTORY OF PRESENT ILLNESS: This is a pleasant 59 year old right-handed woman with a history of diabetes, hypertension, hyperlipidemia, obesity, TIA in 06/2014, presenting with new onset seizures. She was in her usual state of health until 06/02/15, she works from 7pm to 3:30am, and recalls getting ready to leave work. She went to the bathroom, and as she was pulling up her underwear, her right hand significantly cramped up, with her right thumb flexed at the proximal joint. She then noted her right foot to curl in, then apparently lost consciousness. Co-workers found her on the floor at 4:19am. She recalls people talking to her, she felt her right side was weaker, she was slow to answer questions. She was brought to the ER where her daughter witnessed a generalized tonic-clonic seizure. Her daughter reported that she started complaining of right hand pain and cramping, then her right hand assumed an unusual flexed position before she had a GTC lasting 2-3 minutes. Records reviewed, CBC and CMP unremarkable except for glucose of 373. UDS and ethanol level negative. I personally reviewed MRI brain with and without contrast which did not show any acute changes. The study is degraded by patient motion, but there does not appear to be any asymmetry in the hippocampi, no abnormal signal or enhancement seen. She had a normal wake and sleep EEG. She was discharged home on Keppra 500mg  BID, which she is tolerating without side effects.  She denies any further right-hand symptoms. She denies any prior history of seizures, no  olfactory/gustatory hallucinations, deja vu, rising epigastric sensation, focal numbness/tingling/weakness, myoclonic jerks. She has had intermittent numbness in both hands. She has right knee pain and swelling. Otherwise, she denies any headaches, dizziness, diplopia, bowel dysfunction. She has some neck pain and is "peeing like crazy." She has always had sleep difficulties due to her work schedule, she [place] 74 miles away. She had a TIA in 06/2014, records reviewed, she had left-sided numbness and weakness that resolved on arrival to the ER, MRI brain at that time normal, stroke workup unremarkable. She was discharged home on Plavix and instructions for better glucose control. Her most recent HbA1c was 11.1.  Epilepsy Risk Factors:  Her son started having seizures in his 30s. Otherwise she had a normal birth and early development.  There is no history of febrile convulsions, CNS infections such as meningitis/encephalitis, significant traumatic brain injury, neurosurgical procedures.  PAST MEDICAL HISTORY: Past Medical History  Diagnosis Date  . Hypertension   . Hypercholesterolemia   . CHF (congestive heart failure)   . Chronic bronchitis     "gets it pretty much q yr" (06/02/2015)  . OSA on CPAP   . Type II diabetes mellitus   . TIA (transient ischemic attack)     2015  . Arthritis     "knees, hands, bak, hips, ankles" (06/02/2015)  . Seizures 06/02/2015    "1st one was today", generalized tonic clonic seizure- lasting ~2-3 mins(06/02/2015)    PAST SURGICAL HISTORY: Past Surgical History  Procedure Laterality Date  . Tubal  ligation  [id number]    MEDICATIONS: Current Outpatient Prescriptions on File Prior to Visit  Medication Sig Dispense Refill  . Alogliptin-Metformin HCl 12.02-999 MG TABS Take 1 tablet by mouth 2 (two) times daily with a meal.    . amLODipine-benazepril (LOTREL) 10-40 MG per capsule  Take 1 capsule by mouth daily.    Marland Kitchen atorvastatin (LIPITOR) 40 MG tablet Take 1 tablet (40 mg total) by mouth at bedtime. 30 tablet 4  . carvedilol (COREG) 25 MG tablet Take 25 mg by mouth 2 (two) times daily with a meal.    . celecoxib (CELEBREX) 200 MG capsule Take 200 mg by mouth 2 (two) times daily.     . cloNIDine (CATAPRES) 0.1 MG tablet Take 0.1 mg by mouth 2 (two) times daily.    . clopidogrel (PLAVIX) 75 MG tablet Take 75 mg by mouth daily.    . Dulaglutide (TRULICITY) 1.5 MG/0.5ML SOPN Inject 0.5 mLs into the skin once a week.     . furosemide (LASIX) 40 MG tablet Take 40 mg by mouth daily.    . hydrochlorothiazide (HYDRODIURIL) 25 MG tablet Take 1 tablet (25 mg total) by mouth daily. 30 tablet 0  . Insulin Glargine (LANTUS) 100 UNIT/ML Solostar Pen Inject 10 Units into the skin at bedtime. 15 mL 0  . levETIRAcetam (KEPPRA) 500 MG tablet Take 1 tablet (500 mg total) by mouth 2 (two) times daily. 30 tablet 0  . potassium chloride SA (K-DUR,KLOR-CON) 20 MEQ tablet Take 20 mEq by mouth daily.     No current facility-administered medications on file prior to visit.    ALLERGIES: No Known Allergies  FAMILY HISTORY: Family History  Problem Relation Age of Onset  . Hypertension Father   . Hypertension Sister   . Seizures Son     SOCIAL HISTORY: Social History   Social History  . Marital Status: Divorced    Spouse Name: N/A  . Number of Children: N/A  . Years of Education: N/A   Occupational History  . Not on file.   Social History Main Topics  . Smoking status: Never Smoker   . Smokeless tobacco: Never Used  . Alcohol Use: No  . Drug Use: No  . Sexual Activity: Not on file   Other Topics Concern  . Not on file   Social History Narrative    REVIEW OF SYSTEMS: Constitutional: No fevers, chills, or sweats, no generalized fatigue, change in appetite Eyes: No visual changes, double vision, eye pain Ear, nose and throat: No hearing loss, ear pain, nasal congestion,  sore throat Cardiovascular: No chest pain, palpitations Respiratory:  No shortness of breath at rest or with exertion, wheezes GastrointestinaI: No nausea, vomiting, diarrhea, abdominal pain, fecal incontinence Genitourinary:  No dysuria, urinary retention or frequency Musculoskeletal:  No neck pain, back pain Integumentary: No rash, pruritus, skin lesions Neurological: as above Psychiatric: No depression, insomnia, anxiety Endocrine: No palpitations, fatigue, diaphoresis, mood swings, change in appetite, change in weight, increased thirst Hematologic/Lymphatic:  No anemia, purpura, petechiae. Allergic/Immunologic: no itchy/runny eyes, nasal congestion, recent allergic reactions, rashes  PHYSICAL EXAM: Filed Vitals:   06/07/15 1245  BP: 138/82  Pulse: 47   General: No acute distress, obese Head:  Normocephalic/atraumatic Eyes: Fundoscopic exam shows bilateral sharp discs, no vessel changes, exudates, or hemorrhages Neck: supple, no paraspinal tenderness, full range of motion Back: No paraspinal tenderness Heart: regular rate and rhythm Lungs: Clear to auscultation bilaterally. Vascular: No carotid bruits. Skin/Extremities: No rash, no edema Neurological Exam: Mental  status: alert and oriented to person, place, and time, no dysarthria or aphasia, Fund of knowledge is appropriate.  Recent and remote memory are intact. 3/3 delayed recall. Attention and concentration are normal.    Able to name objects and repeat phrases. Cranial nerves: CN I: not tested CN II: pupils equal, round and reactive to light, visual fields intact, fundi unremarkable. CN III, IV, VI:  full range of motion, no nystagmus, no ptosis CN V: facial sensation intact CN VII: upper and lower face symmetric CN VIII: hearing intact to finger rub CN IX, X: gag intact, uvula midline CN XI: sternocleidomastoid and trapezius muscles intact CN XII: tongue midline Bulk & Tone: normal, no fasciculations. Motor: 5/5  throughout with no pronator drift. Sensation: decreased cold on right foot, reports hyperesthesia to pi on left foot, intact to light touch, cold, pin on both UE, decreased vibration to ankles bilaterally, intact joint position sense.  No extinction to double simultaneous stimulation.  Romberg test negative Deep Tendon Reflexes: +2 on both UE, unable to elicit reflexes in both LE, no ankle clonus Plantar responses: downgoing bilaterally Cerebellar: no incoordination on finger to nose, heel to shin. No dysdiadochokinesia Gait: slow and cautious, ambulates with cane, favoring right leg due to pain. No ataxia Tremor: none  IMPRESSION: This is a pleasant 59 year old right-handed woman with a history of hypertension, hyperlipidemia, diabetes, TIA, presenting with new onset seizure. She had 2 seizures in one day, both preceded by right hand (and foot during first event) dystonic posturing, suggestive of partial seizure that secondarily generalized possibly from the left temporal region. MRI brain and routine EEG unremarkable. Recommend continuation of anti-epileptic medication, she is tolerating Keppra 500mg  BID without side effects. Continue current dose for now, she knows to call our office for any recurrence of dystonic posturing or change in symptoms. We discussed better control of glucose and BP for secondary stroke prevention. Continue daily Plavix. [place] driving laws were discussed with the patient, and she knows to stop driving after a seizure, until 6 months seizure-free. She will follow-up in 3 months.   Thank you for allowing me to participate in the care of this patient. Please do not hesitate to call for any questions or concerns.   Patrcia Dolly, M.D.  CC: Dr. Coralee Potter

## 2015-08-26 ENCOUNTER — Other Ambulatory Visit: Payer: Self-pay | Admitting: Internal Medicine

## 2015-08-31 ENCOUNTER — Other Ambulatory Visit: Payer: Self-pay | Admitting: Internal Medicine

## 2015-09-07 ENCOUNTER — Encounter: Payer: Self-pay | Admitting: Neurology

## 2015-09-07 ENCOUNTER — Ambulatory Visit (INDEPENDENT_AMBULATORY_CARE_PROVIDER_SITE_OTHER): Payer: Federal, State, Local not specified - PPO | Admitting: Neurology

## 2015-09-07 VITALS — BP 142/80 | HR 80 | Ht 60.0 in | Wt 267.0 lb

## 2015-09-07 DIAGNOSIS — R569 Unspecified convulsions: Secondary | ICD-10-CM | POA: Diagnosis not present

## 2015-09-07 DIAGNOSIS — G40009 Localization-related (focal) (partial) idiopathic epilepsy and epileptic syndromes with seizures of localized onset, not intractable, without status epilepticus: Secondary | ICD-10-CM | POA: Diagnosis not present

## 2015-09-07 MED ORDER — LEVETIRACETAM 500 MG PO TABS: 500.0000 mg | ORAL_TABLET | Freq: Two times a day (BID) | ORAL | Status: AC

## 2015-09-07 NOTE — Patient Instructions (Signed)
1. Continue Keppra 500mg  twice a day 2. Follow-up in 6 months  Seizure Precautions: 1. If medication has been prescribed for you to prevent seizures, take it exactly as directed.  Do not stop taking the medicine without talking to your doctor first, even if you have not had a seizure in a long time.   2. Avoid activities in which a seizure would cause danger to yourself or to others.  Don't operate dangerous machinery, swim alone, or climb in high or dangerous places, such as on ladders, roofs, or girders.  Do not drive unless your doctor says you may.  3. If you have any warning that you may have a seizure, lay down in a safe place where you can't hurt yourself.    4.  No driving for 6 months from last seizure, as per [place].   Please refer to the following link on the [employer]'s website for more information: http://www.epilepsyfoundation.org/answerplace/Social/driving/drivingu.cfm   5.  Maintain good sleep hygiene. Avoid alcohol.  6.  Contact your doctor if you have any problems that may be related to the medicine you are taking.  7.  Call 911 and bring the patient back to the ED if:        A.  The seizure lasts longer than 5 minutes.       B.  The patient doesn't awaken shortly after the seizure  C.  The patient has new problems such as difficulty seeing, speaking or moving  D.  The patient was injured during the seizure  E.  The patient has a temperature over 102 F (39C)  F.  The patient vomited and now is having trouble breathing

## 2015-09-07 NOTE — Progress Notes (Signed)
NEUROLOGY FOLLOW UP OFFICE NOTE  Christy Potter [id number]  HISTORY OF PRESENT ILLNESS: I had the pleasure of seeing Christy Potter in follow-up in the neurology clinic on 09/07/2015.  The patient was last seen 3 months ago for new onset seizures last 06/02/15 preceded by right-sided dystonic posturing. She was started on Keppra  BID with no further similar symptoms. She denies any further episodes of dystonic posturing. She felt cold and numb one time, and reports dizziness when her BP is 120 or 128 (typically runs 140-170). She has mild back pain. She denies any falls. No side effects on Keppra.   HPI: This is a pleasant 59 yo RH woman with a history of diabetes, hypertension, hyperlipidemia, obesity, TIA in 06/2014, with new onset seizures. She was in her usual state of health until 06/02/15, she works from 7pm to 3:30am, and recalls getting ready to leave work. She went to the bathroom, and as she was pulling up her underwear, her right hand significantly cramped up, with her right thumb flexed at the proximal joint. She then noted her right foot to curl in, then apparently lost consciousness. Co-workers found her on the floor at 4:19am. She recalls people talking to her, she felt her right side was weaker, she was slow to answer questions. She was brought to the ER where her daughter witnessed a generalized tonic-clonic seizure. Her daughter reported that she started complaining of right hand pain and cramping, then her right hand assumed an unusual flexed position before she had a GTC lasting 2-3 minutes. Records reviewed, CBC and CMP unremarkable except for glucose of 373. UDS and ethanol level negative. I personally reviewed MRI brain with and without contrast which did not show any acute changes. The study is degraded by patient motion, but there does not appear to be any asymmetry in the hippocampi, no abnormal signal or enhancement seen. She had a normal wake and sleep EEG. She was  discharged home on Keppra  BID, which she is tolerating without side effects.   She had a TIA in 06/2014, records reviewed, she had left-sided numbness and weakness that resolved on arrival to the ER, MRI brain at that time normal, stroke workup unremarkable. She was discharged home on Plavix and instructions for better glucose control. Her most recent HbA1c was 11.1.  Epilepsy Risk Factors: Her son started having seizures in his 30s. Otherwise she had a normal birth and early development. There is no history of febrile convulsions, CNS infections such as meningitis/encephalitis, significant traumatic brain injury, neurosurgical procedures.  PAST MEDICAL HISTORY: Past Medical History  Diagnosis Date  . Hypertension   . Hypercholesterolemia   . CHF (congestive heart failure) (HCC)   . Chronic bronchitis (HCC)     "gets it pretty much q yr" (06/02/2015)  . OSA on CPAP   . Type II diabetes mellitus (HCC)   . TIA (transient ischemic attack)     2015  . Arthritis     "knees, hands, bak, hips, ankles" (06/02/2015)  . Seizures (HCC) 06/02/2015    "1st one was today", generalized tonic clonic seizure- lasting ~2-3 mins(06/02/2015)    MEDICATIONS: Current Outpatient Prescriptions on File Prior to Visit  Medication Sig Dispense Refill  . celecoxib (CELEBREX) 200 MG capsule Take 200 mg by mouth 2 (two) times daily.     . Insulin Glargine (LANTUS) 100 UNIT/ML Solostar Pen Inject 10 Units into the skin at bedtime. (Patient taking differently: Inject 20 Units into the skin at bedtime. )  15 mL 0  . potassium chloride SA (K-DUR,KLOR-CON) 20 MEQ tablet Take 20 mEq by mouth 2 (two) times daily.     . Alogliptin-Metformin HCl 12.02-999 MG TABS Take 1 tablet by mouth 2 (two) times daily with a meal.    . atorvastatin (LIPITOR) 40 MG tablet Take 1 tablet (40 mg total) by mouth at bedtime. 30 tablet 4  . Butalbital-Acetaminophen 50-300 MG TABS Take by mouth.    . carvedilol (COREG) 25 MG tablet Take 25  mg by mouth 2 (two) times daily with a meal.    . cloNIDine (CATAPRES) 0.1 MG tablet Take 0.1 mg by mouth 2 (two) times daily.    . clopidogrel (PLAVIX) 75 MG tablet Take 75 mg by mouth daily.    . Dulaglutide (TRULICITY) 1.5 MG/0.5ML SOPN Inject 0.5 mLs into the skin once a week.     . furosemide (LASIX) 40 MG tablet Take 40 mg by mouth daily.    Marland Kitchen. glimepiride (AMARYL) 4 MG tablet     . hydrochlorothiazide (HYDRODIURIL) 25 MG tablet Take 1 tablet (25 mg total) by mouth daily. 30 tablet 0  . levETIRAcetam (KEPPRA) 500 MG tablet Take 1 tablet (500 mg total) by mouth 2 (two) times daily. 60 tablet 6  . meloxicam (MOBIC) 15 MG tablet     . telmisartan-hydrochlorothiazide (MICARDIS HCT) 80-25 MG per tablet Take 1 tablet by mouth daily.    . TRADJENTA 5 MG TABS tablet     . traMADol (ULTRAM) 50 MG tablet Take by mouth every 6 (six) hours as needed.     No current facility-administered medications on file prior to visit.    ALLERGIES: No Known Allergies  FAMILY HISTORY: Family History  Problem Relation Age of Onset  . Hypertension Father   . Hypertension Sister   . Seizures Son     SOCIAL HISTORY: Social History   Social History  . Marital Status: Divorced    Spouse Name: N/A  . Number of Children: N/A  . Years of Education: N/A   Occupational History  . Not on file.   Social History Main Topics  . Smoking status: Never Smoker   . Smokeless tobacco: Never Used  . Alcohol Use: No  . Drug Use: No  . Sexual Activity: Not on file   Other Topics Concern  . Not on file   Social History Narrative    REVIEW OF SYSTEMS: Constitutional: No fevers, chills, or sweats, no generalized fatigue, change in appetite Eyes: No visual changes, double vision, eye pain Ear, nose and throat: No hearing loss, ear pain, nasal congestion, sore throat Cardiovascular: No chest pain, palpitations Respiratory:  No shortness of breath at rest or with exertion, wheezes GastrointestinaI: No nausea,  vomiting, diarrhea, abdominal pain, fecal incontinence Genitourinary:  No dysuria, urinary retention or frequency Musculoskeletal:  No neck pain,+ back pain Integumentary: No rash, pruritus, skin lesions Neurological: as above Psychiatric: No depression, insomnia, anxiety Endocrine: No palpitations, fatigue, diaphoresis, mood swings, change in appetite, change in weight, increased thirst Hematologic/Lymphatic:  No anemia, purpura, petechiae. Allergic/Immunologic: no itchy/runny eyes, nasal congestion, recent allergic reactions, rashes  PHYSICAL EXAM: Filed Vitals:   09/07/15 1433  BP: 142/80  Pulse: 80   General: No acute distress Head:  Normocephalic/atraumatic Neck: supple, no paraspinal tenderness, full range of motion Heart:  Regular rate and rhythm Lungs:  Clear to auscultation bilaterally Back: No paraspinal tenderness Skin/Extremities: No rash, no edema Neurological Exam: alert and oriented to person, place, and time. No  aphasia or dysarthria. Fund of knowledge is appropriate.  Recent and remote memory are intact.  Attention and concentration are normal.    Able to name objects and repeat phrases. Cranial nerves: Pupils equal, round, reactive to light.  Fundoscopic exam unremarkable, no papilledema. Extraocular movements intact with no nystagmus. Visual fields full. Facial sensation intact. No facial asymmetry. Tongue, uvula, palate midline.  Motor: Bulk and tone normal, muscle strength 5/5 throughout with no pronator drift.  Sensation to light touch intact.  No extinction to double simultaneous stimulation.  Deep tendon reflexes +1 throughout, toes downgoing.  Finger to nose testing intact.  Gait narrow-based and steady, able to tandem walk adequately.  Romberg negative.  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 MRI brain and routine EEG unremarkable. She is tolerating Keppra  BID without side effects or further similar events. Continue current dose. She will speak to PCP regarding BP concerns. Continue control of vascular risk factors and daily Plavix for secondary stroke prevention. She is aware of  driving laws to stop driving after a seizure, until 6 months seizure-free. She will follow-up in 6 months and knows to call our office for any changes.   Thank you for allowing me to participate in her care.  Please do not hesitate to call for any questions or concerns.  The duration of this appointment visit was 24 minutes of face-to-face time with the patient.  Greater than 50% of this time was spent in counseling, explanation of diagnosis, planning of further management, and coordination of care.   Patrcia Dolly, M.D.   CC: Dr. Coralee Pesa

## 2015-09-12 ENCOUNTER — Encounter: Payer: Self-pay | Admitting: Neurology

## 2015-12-08 ENCOUNTER — Encounter: Payer: Self-pay | Admitting: Neurology

## 2016-02-29 ENCOUNTER — Telehealth: Payer: Self-pay | Admitting: Family Medicine

## 2016-02-29 NOTE — Telephone Encounter (Signed)
[facility] stating that the patient may potentially nonadherent with levetiracetm 500 mg. Letter states that a 30 day supply was last filled on  12/30/2015.  Will call patient to discuss to make sure she is being compliant with her medication.

## 2016-03-03 ENCOUNTER — Telehealth: Payer: Self-pay | Admitting: Neurology

## 2016-03-03 NOTE — Telephone Encounter (Signed)
After trying to call patient a few times with no answer or vm. Called patients dtr/Kamara who is on patients DPR, left msg asking her if she would have her mother give me a call back.

## 2016-03-03 NOTE — Telephone Encounter (Signed)
Christy Potter 2056/09/23. She was returning your call. Her number is [phone]. Thank you

## 2016-03-06 NOTE — Telephone Encounter (Signed)
Patient returned my call. Explained to her my reason for calling about [facility]. Patient states that she has been taking her medication everyday & hasn't missed any doses and has been getting her refills.

## 2016-03-09 ENCOUNTER — Ambulatory Visit: Payer: Federal, State, Local not specified - PPO | Admitting: Neurology

## 2016-07-10 ENCOUNTER — Ambulatory Visit: Payer: Federal, State, Local not specified - PPO | Admitting: Neurology

## 2016-07-10 DIAGNOSIS — Z029 Encounter for administrative examinations, unspecified: Secondary | ICD-10-CM

## 2016-12-20 ENCOUNTER — Other Ambulatory Visit: Payer: Self-pay | Admitting: Neurology

## 2016-12-20 DIAGNOSIS — G40009 Localization-related (focal) (partial) idiopathic epilepsy and epileptic syndromes with seizures of localized onset, not intractable, without status epilepticus: Secondary | ICD-10-CM

## 2016-12-29 ENCOUNTER — Other Ambulatory Visit: Payer: Self-pay | Admitting: Neurology

## 2016-12-29 DIAGNOSIS — G40009 Localization-related (focal) (partial) idiopathic epilepsy and epileptic syndromes with seizures of localized onset, not intractable, without status epilepticus: Secondary | ICD-10-CM

## 2017-01-16 ENCOUNTER — Other Ambulatory Visit: Payer: Self-pay | Admitting: Neurology

## 2017-01-16 DIAGNOSIS — G40009 Localization-related (focal) (partial) idiopathic epilepsy and epileptic syndromes with seizures of localized onset, not intractable, without status epilepticus: Secondary | ICD-10-CM

## 2017-04-01 IMAGING — DX DG HIP (WITH OR WITHOUT PELVIS) 2-3V*R*
4 series · 4 of 4 positions shown · non-contrast
Comparison: None.

CLINICAL DATA: Fall.  Pain.  Initial evaluation .

EXAM:
DG HIP (WITH OR WITHOUT PELVIS) 2-3V RIGHT

[pelvis ap]
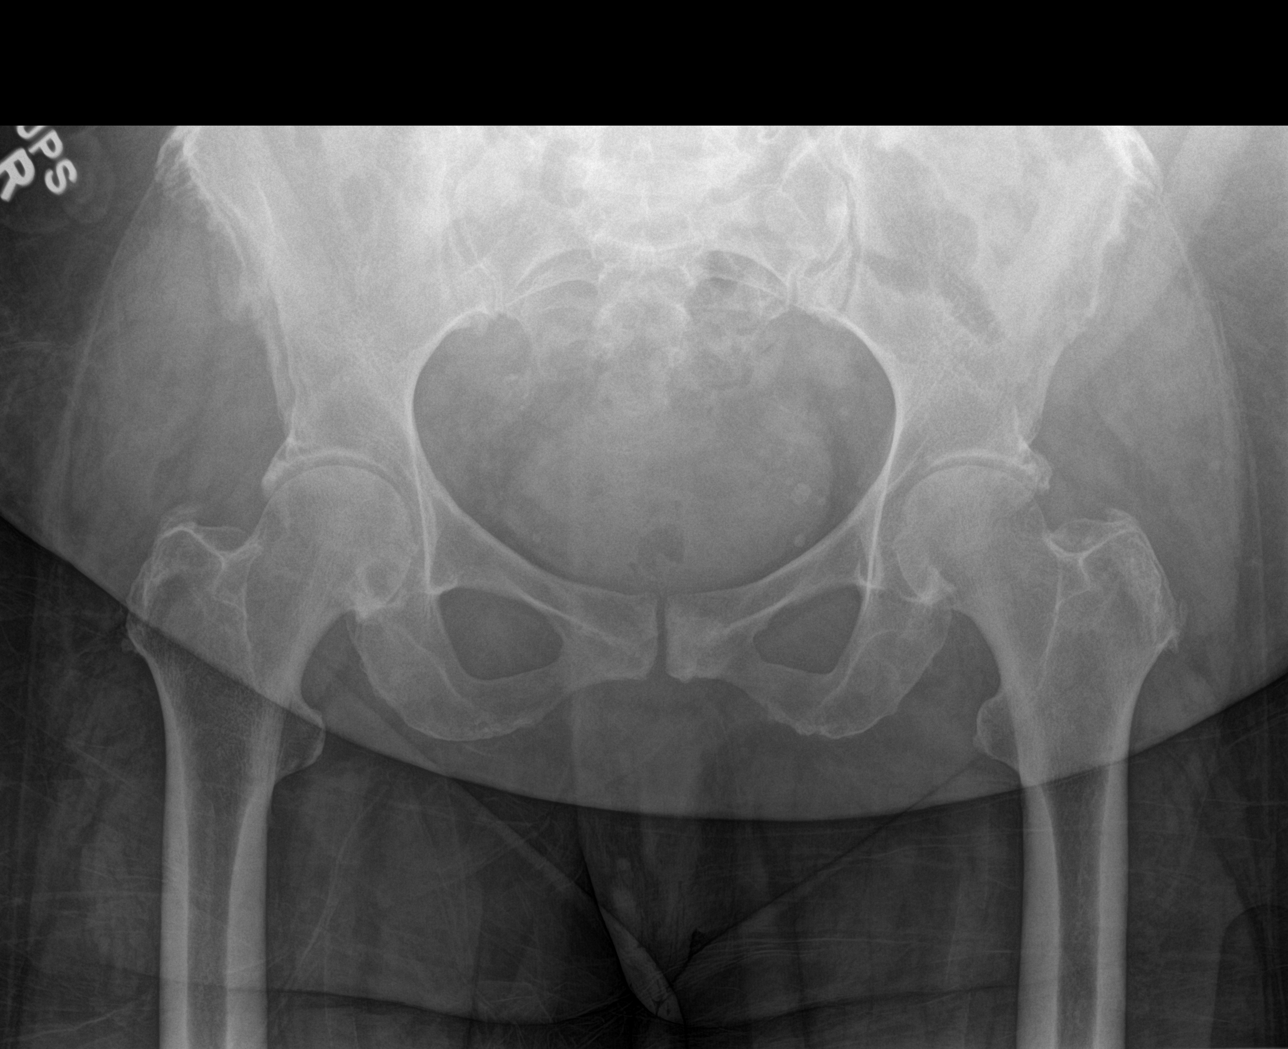

[hip ap]
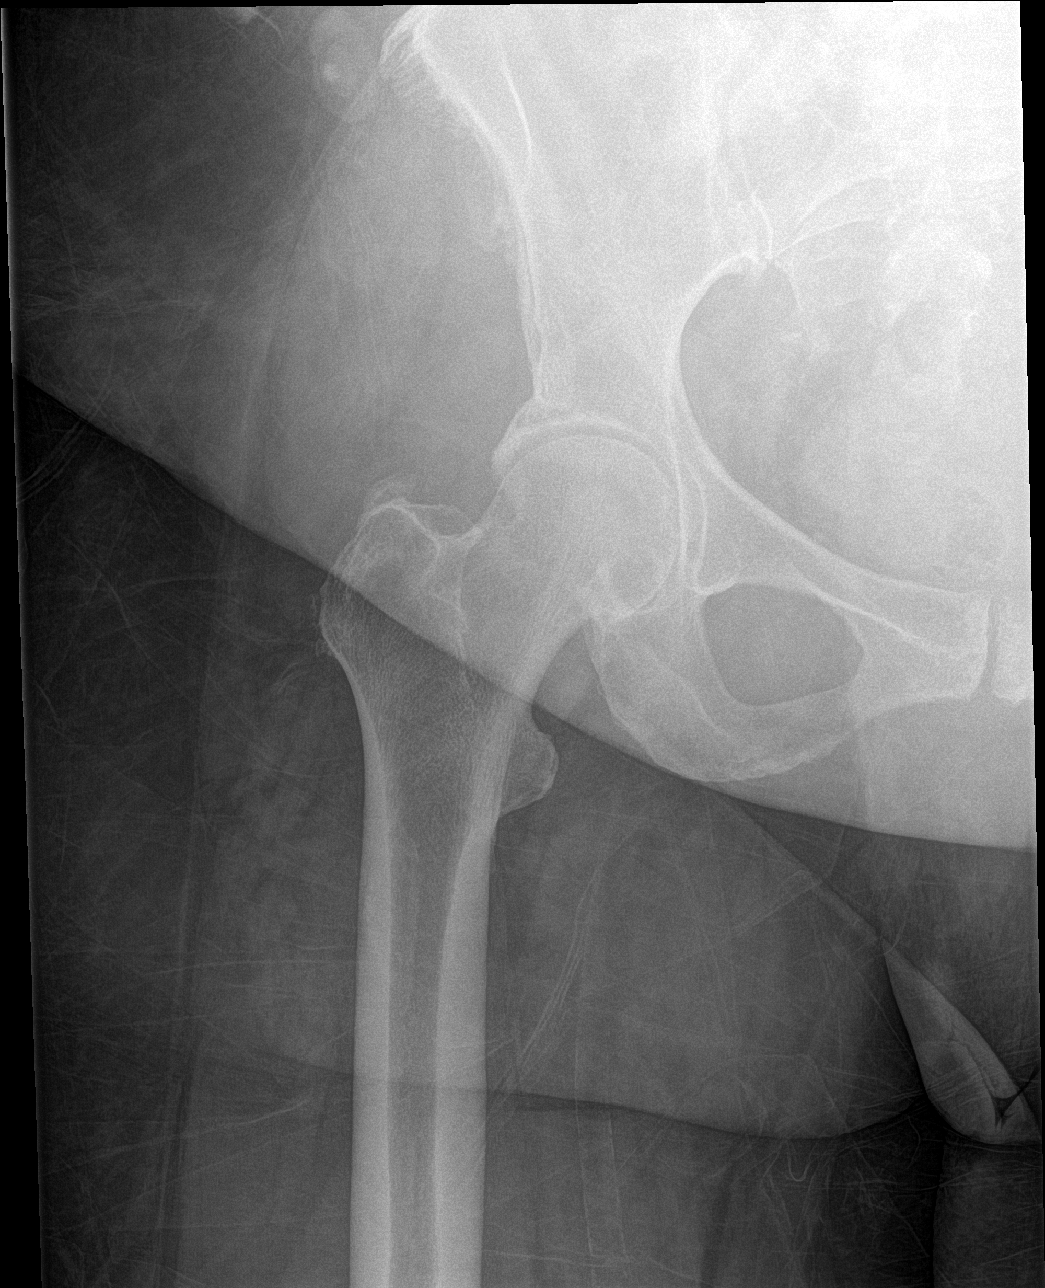

[hip x-table (1 of 2)]
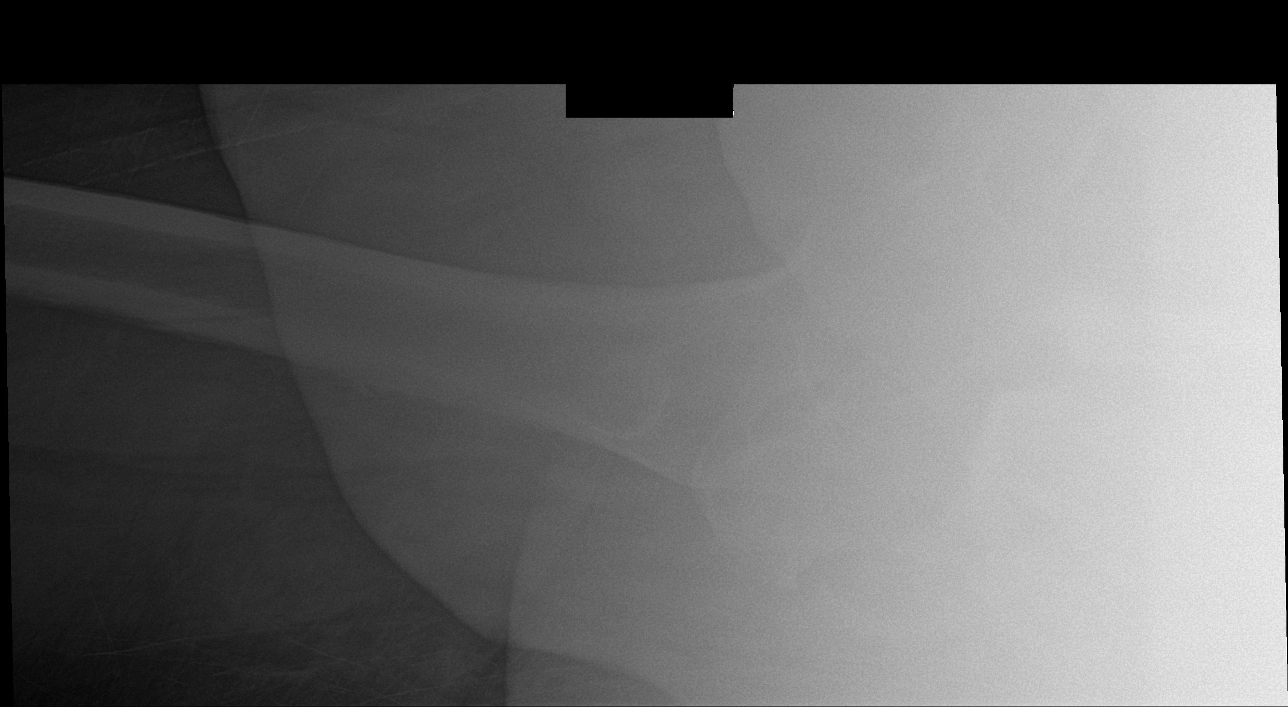

[hip x-table (2 of 2)]
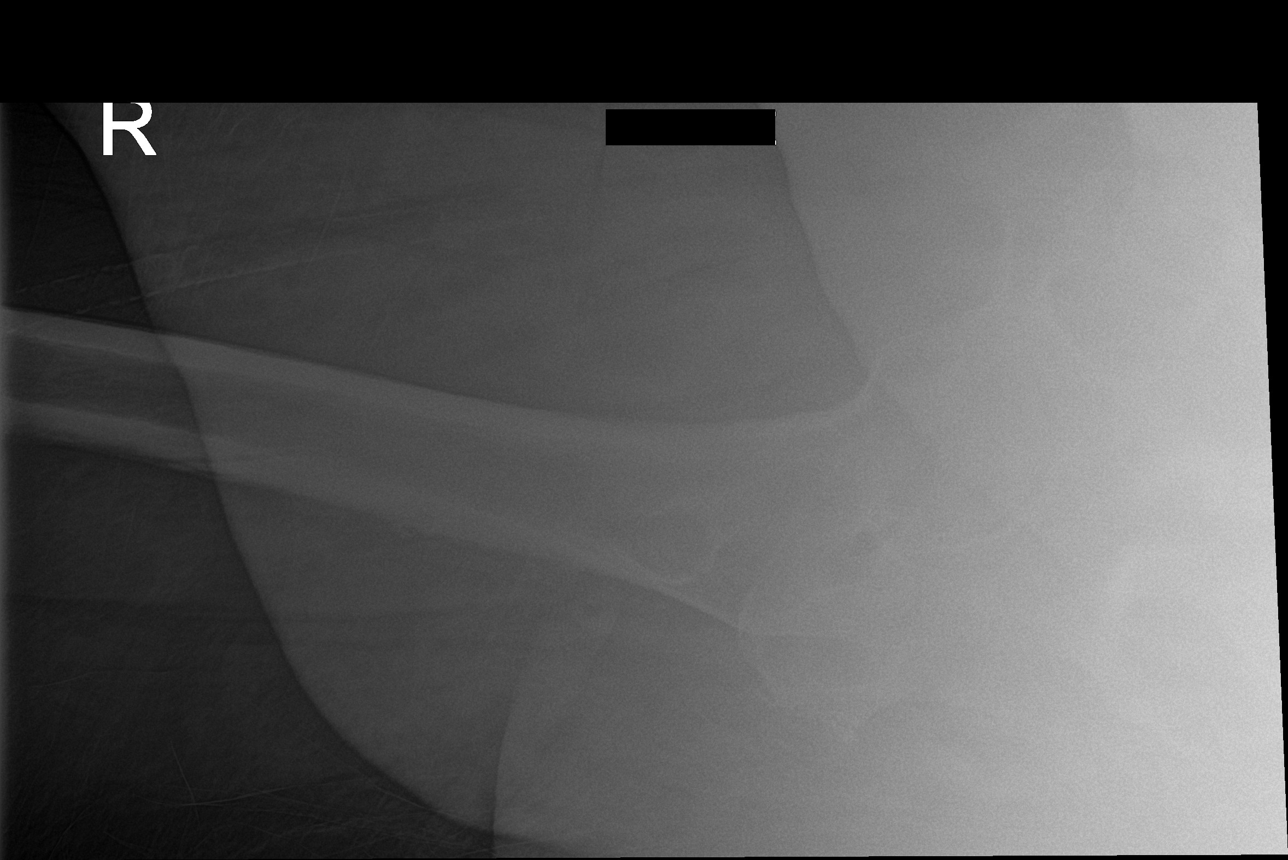

[4 of 4 positions shown; findings below may reference images not displayed]

FINDINGS: Degenerative changes lumbar spine and both hips. No acute
abnormality identified. No evidence of fracture or dislocation.
IMPRESSION: No acute abnormality.

## 2017-11-16 ENCOUNTER — Ambulatory Visit: Payer: Federal, State, Local not specified - PPO | Admitting: Neurology
